# Patient Record
Sex: Female | Born: 1935 | Race: Black or African American | Hispanic: No | Smoking: Never smoker
Health system: Southern US, Community
[De-identification: ages and names within clinical notes are randomized; demographics above are authoritative.]

## PROBLEM LIST (undated history)

## (undated) VITALS — BP 152/78 | HR 90 | Ht 60.75 in | Wt 132.7 lb

## (undated) DIAGNOSIS — I10 Essential (primary) hypertension: Secondary | ICD-10-CM

## (undated) DIAGNOSIS — B029 Zoster without complications: Secondary | ICD-10-CM

---

## 2000-01-03 ENCOUNTER — Ambulatory Visit (HOSPITAL_COMMUNITY): Admission: RE | Admit: 2000-01-03 | Discharge: 2000-01-03 | Payer: Self-pay | Admitting: Family Medicine

## 2004-11-16 ENCOUNTER — Ambulatory Visit: Payer: Self-pay | Admitting: Family Medicine

## 2004-11-16 ENCOUNTER — Inpatient Hospital Stay (HOSPITAL_COMMUNITY): Admission: EM | Admit: 2004-11-16 | Discharge: 2004-11-18 | Payer: Self-pay | Admitting: *Deleted

## 2005-01-13 ENCOUNTER — Ambulatory Visit: Payer: Self-pay | Admitting: Family Medicine

## 2005-01-21 ENCOUNTER — Ambulatory Visit: Payer: Self-pay | Admitting: Family Medicine

## 2005-10-17 ENCOUNTER — Ambulatory Visit: Payer: Self-pay | Admitting: Family Medicine

## 2005-10-27 ENCOUNTER — Ambulatory Visit: Payer: Self-pay | Admitting: Family Medicine

## 2006-01-20 ENCOUNTER — Ambulatory Visit: Payer: Self-pay | Admitting: Sports Medicine

## 2006-02-21 ENCOUNTER — Ambulatory Visit: Payer: Self-pay | Admitting: Family Medicine

## 2006-02-21 ENCOUNTER — Encounter (INDEPENDENT_AMBULATORY_CARE_PROVIDER_SITE_OTHER): Payer: Self-pay | Admitting: *Deleted

## 2006-02-21 LAB — CONVERTED CEMR LAB
BUN: 24 mg/dL — ABNORMAL HIGH (ref 6–23)
CO2: 27 meq/L (ref 19–32)
Calcium: 9 mg/dL (ref 8.4–10.5)
Chloride: 102 meq/L (ref 96–112)
Creatinine, Ser: 1.01 mg/dL (ref 0.40–1.20)
Glucose, Bld: 90 mg/dL (ref 70–99)
Potassium: 4.8 meq/L (ref 3.5–5.3)
Sodium: 136 meq/L (ref 135–145)

## 2006-03-24 ENCOUNTER — Ambulatory Visit: Payer: Self-pay | Admitting: Family Medicine

## 2006-04-06 DIAGNOSIS — I10 Essential (primary) hypertension: Secondary | ICD-10-CM | POA: Insufficient documentation

## 2006-04-06 DIAGNOSIS — K5900 Constipation, unspecified: Secondary | ICD-10-CM | POA: Insufficient documentation

## 2007-05-15 ENCOUNTER — Encounter (INDEPENDENT_AMBULATORY_CARE_PROVIDER_SITE_OTHER): Payer: Self-pay | Admitting: *Deleted

## 2007-05-15 ENCOUNTER — Ambulatory Visit: Payer: Self-pay | Admitting: Family Medicine

## 2007-05-15 LAB — CONVERTED CEMR LAB
ALT: 12 units/L (ref 0–35)
AST: 22 units/L (ref 0–37)
Albumin: 3.8 g/dL (ref 3.5–5.2)
Alkaline Phosphatase: 67 units/L (ref 39–117)
BUN: 25 mg/dL — ABNORMAL HIGH (ref 6–23)
CO2: 28 meq/L (ref 19–32)
Calcium: 8.8 mg/dL (ref 8.4–10.5)
Chloride: 102 meq/L (ref 96–112)
Creatinine, Ser: 1.02 mg/dL (ref 0.40–1.20)
Direct LDL: 121 mg/dL — ABNORMAL HIGH
Glucose, Bld: 83 mg/dL (ref 70–99)
Potassium: 4 meq/L (ref 3.5–5.3)
Sodium: 139 meq/L (ref 135–145)
Total Bilirubin: 0.3 mg/dL (ref 0.3–1.2)
Total Protein: 6.9 g/dL (ref 6.0–8.3)

## 2007-05-16 ENCOUNTER — Encounter (INDEPENDENT_AMBULATORY_CARE_PROVIDER_SITE_OTHER): Payer: Self-pay | Admitting: *Deleted

## 2007-06-01 ENCOUNTER — Ambulatory Visit: Payer: Self-pay | Admitting: Family Medicine

## 2007-06-04 ENCOUNTER — Ambulatory Visit: Payer: Self-pay | Admitting: Family Medicine

## 2007-06-04 LAB — CONVERTED CEMR LAB: LDL Cholesterol: 121 mg/dL

## 2007-06-12 ENCOUNTER — Ambulatory Visit: Payer: Self-pay | Admitting: Family Medicine

## 2007-07-17 ENCOUNTER — Encounter (INDEPENDENT_AMBULATORY_CARE_PROVIDER_SITE_OTHER): Payer: Self-pay | Admitting: *Deleted

## 2007-07-17 ENCOUNTER — Ambulatory Visit: Payer: Self-pay | Admitting: Family Medicine

## 2007-07-17 DIAGNOSIS — N951 Menopausal and female climacteric states: Secondary | ICD-10-CM

## 2007-07-19 LAB — CONVERTED CEMR LAB: Vit D, 1,25-Dihydroxy: 13 — ABNORMAL LOW (ref 30–89)

## 2007-08-27 ENCOUNTER — Encounter: Admission: RE | Admit: 2007-08-27 | Discharge: 2007-08-27 | Payer: Self-pay | Admitting: *Deleted

## 2007-10-05 ENCOUNTER — Telehealth: Payer: Self-pay | Admitting: Family Medicine

## 2007-10-25 ENCOUNTER — Ambulatory Visit: Payer: Self-pay | Admitting: Family Medicine

## 2007-10-25 DIAGNOSIS — M810 Age-related osteoporosis without current pathological fracture: Secondary | ICD-10-CM | POA: Insufficient documentation

## 2008-03-07 ENCOUNTER — Ambulatory Visit: Payer: Self-pay | Admitting: Family Medicine

## 2008-03-07 DIAGNOSIS — M79609 Pain in unspecified limb: Secondary | ICD-10-CM

## 2009-01-23 ENCOUNTER — Ambulatory Visit: Payer: Self-pay | Admitting: Family Medicine

## 2009-01-23 ENCOUNTER — Emergency Department (HOSPITAL_COMMUNITY): Admission: EM | Admit: 2009-01-23 | Discharge: 2009-01-24 | Payer: Self-pay | Admitting: Emergency Medicine

## 2009-01-23 ENCOUNTER — Encounter: Payer: Self-pay | Admitting: Family Medicine

## 2009-01-26 LAB — CONVERTED CEMR LAB
BUN: 21 mg/dL (ref 6–23)
CO2: 22 meq/L (ref 19–32)
Calcium: 9.1 mg/dL (ref 8.4–10.5)
Chloride: 88 meq/L — ABNORMAL LOW (ref 96–112)
Creatinine, Ser: 0.98 mg/dL (ref 0.40–1.20)
Glucose, Bld: 107 mg/dL — ABNORMAL HIGH (ref 70–99)
Potassium: 3.6 meq/L (ref 3.5–5.3)
Sodium: 125 meq/L — ABNORMAL LOW (ref 135–145)

## 2009-02-11 ENCOUNTER — Ambulatory Visit: Payer: Self-pay | Admitting: Family Medicine

## 2009-02-11 ENCOUNTER — Encounter: Payer: Self-pay | Admitting: Family Medicine

## 2009-02-11 DIAGNOSIS — E871 Hypo-osmolality and hyponatremia: Secondary | ICD-10-CM | POA: Insufficient documentation

## 2009-02-12 LAB — CONVERTED CEMR LAB
BUN: 22 mg/dL (ref 6–23)
CO2: 21 meq/L (ref 19–32)
Calcium: 8.5 mg/dL (ref 8.4–10.5)
Chloride: 104 meq/L (ref 96–112)
Creatinine, Ser: 0.84 mg/dL (ref 0.40–1.20)
Glucose, Bld: 83 mg/dL (ref 70–99)
Potassium: 4.1 meq/L (ref 3.5–5.3)
Sodium: 139 meq/L (ref 135–145)

## 2009-10-09 ENCOUNTER — Telehealth: Payer: Self-pay | Admitting: Family Medicine

## 2009-10-28 ENCOUNTER — Ambulatory Visit: Payer: Self-pay | Admitting: Internal Medicine

## 2009-10-28 ENCOUNTER — Observation Stay (HOSPITAL_COMMUNITY): Admission: EM | Admit: 2009-10-28 | Discharge: 2009-10-29 | Payer: Self-pay | Admitting: Emergency Medicine

## 2009-10-29 ENCOUNTER — Encounter (INDEPENDENT_AMBULATORY_CARE_PROVIDER_SITE_OTHER): Payer: Self-pay | Admitting: Internal Medicine

## 2009-11-02 ENCOUNTER — Emergency Department (HOSPITAL_COMMUNITY)
Admission: EM | Admit: 2009-11-02 | Discharge: 2009-11-02 | Payer: Self-pay | Source: Home / Self Care | Admitting: Emergency Medicine

## 2009-11-19 ENCOUNTER — Ambulatory Visit: Payer: Self-pay | Admitting: Family Medicine

## 2010-01-22 ENCOUNTER — Ambulatory Visit: Payer: Self-pay | Admitting: Family Medicine

## 2010-01-26 ENCOUNTER — Ambulatory Visit: Payer: Self-pay | Admitting: Family Medicine
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
## 2010-03-09 NOTE — Assessment & Plan Note (Signed)
Summary: f/u from being in hospital,tcb   Vital Signs:  Patient profile:   77 year old female Height:      60.75 inches Weight:      129.7 pounds BMI:     24.80 Temp:     98.7 degrees F oral Pulse rate:   80 / minute BP sitting:   148 / 80  (left arm) Cuff size:   regular  Vitals Entered By: Jimmy Footman, CMA (November 19, 2009 11:48 AM) CC: HFU hypertension   Primary Care Provider:  Bobby Rumpf  MD  CC:  HFU hypertension.  History of Present Illness: HTN, follow up hospitalization for lightheadedness: Admitted to [facility] for brief observation rom 9/21 - 9/23 (not on FPTS) for lightheadedness (without LOC) felt to be related to mild dehydration. CT head was within normal limits as were EKG, cardiac enzymes and TSH. Patient was found to be hyponatremic to 130, as a result her HCTZ was discontinued and she was started on Norvasc 10 mg. Returned to [facility] for evulation secondary to "high blood pressure" - no symptoms were reported at that time. Blood pressure was noted to be in 170's systolics - patient was started on lisinopril 5 mg as well. Patient reported some lightheadedness with lisinopril as well - as a result she stopped taking it shortly after. She has not had any episodes of dizziness since then.   ROS: Denies chest pain, dyspnea, LE edema, focal neurological signs, cough, wheeze, vision change,     .       Current Medications (verified): 1)  Norvasc 10 Mg Tabs (Amlodipine Besylate) .Marland Kitchen.. 1 By Mouth Once Daily 2)  Calcium Carbonate 600 Mg  Tabs (Calcium Carbonate) .Marland Kitchen.. 1 By Mouth Two Times A Day 3)  Tylenol Extra Strength 500 Mg Tabs (Acetaminophen) .... One Tab By Mouth Q4h As Needed Pain.  Allergies (verified): No Known Drug Allergies  Physical Exam  General:  NAD, elderly female, pleasant Mouth:  moist mucus membranes  Lungs:  CTAB  Heart:  RRR, no murmurs  Pulses:  2+ radials  Neurologic:  alert & oriented X3, cranial nerves II-XII intact, and strength normal  in all extremities.   Psych:  Oriented X3 and memory intact for recent and remote.     Impression & Recommendations:  Problem # 1:  DIZZINESS (ICD-780.4) Assessment Improved  Likely secondary to mild dehydration with negative workup at hospital, resolution since stopping HCTZ and lisinopril. Will not restart these medications at this time. Advised patient to stay well hydrated. Will follow in three months.   Orders: [facility]- Est  Level 4 (16109)  Problem # 2:  HYPERTENSION, BENIGN SYSTEMIC (ICD-401.1)  More permissive goal of 150/90 given age. Will hold off on ACE-I and on diuretic given lightheadedness with both - will continue CCB as below. Follow up three months The following medications were removed from the medication list:    Hydrochlorothiazide 12.5 Mg Tabs (Hydrochlorothiazide) .Marland Kitchen... Take 1 tab  by mouth every morning Her updated medication list for this problem includes:    Norvasc 10 Mg Tabs (Amlodipine besylate) .Marland Kitchen... 1 by mouth once daily  BP today: 148/80 Prior BP: 183/73 (02/11/2009)  Labs Reviewed: K+: 4.1 (02/11/2009) Creat: : 0.84 (02/11/2009)   LDL: 121 (06/04/2007)     Orders: [facility]- Est  Level 4 (60454)  Complete Medication List: 1)  Norvasc 10 Mg Tabs (Amlodipine besylate) .Marland Kitchen.. 1 by mouth once daily 2)  Calcium Carbonate 600 Mg Tabs (Calcium carbonate) .Marland KitchenMarland KitchenMarland Kitchen 1  by mouth two times a day 3)  Tylenol Extra Strength 500 Mg Tabs (Acetaminophen) .... One tab by mouth q4h as needed pain.

## 2010-03-09 NOTE — Assessment & Plan Note (Signed)
Summary: f/u hypertension, hyponatremia/EO   Vital Signs:  Patient profile:   77 year old female Height:      60.75 inches Weight:      134 pounds Temp:     97.9 degrees F oral Pulse rate:   69 / minute BP sitting:   183 / 73  (left arm) Cuff size:   regular  Vitals Entered By: Tessie Fass CMA (February 11, 2009 2:35 PM) CC: F/U BP, hyponatremia Is Patient Diabetic? No Pain Assessment Patient in pain? no        Primary Care Provider:  Bobby Rumpf  MD  CC:  F/U BP and hyponatremia.  History of Present Illness: 1) Elevated BP: BP today 183/73, 180/84 on recheck.  Has missed past three days of medication due to travel. Denies headache, chest pain, dyspnea, LE edema, neurological symptoms, change in urination. Seen at [facility] in December due to concern for elevated BP at home. CT head for headache on 12/18 in ED showed no acute intracranial abnormality, showed some atrophy, chronic microvascular disease. Found to be hyponatremic to 124 (Na 125 on labs here on 12/17). Does not cook with salt, but not currently monitoring salt intake. Denies seizure activity, AMS.  Not exercising currently. Lives alone, says she does not need help with her medications.   Habits & Providers  Alcohol-Tobacco-Diet     Tobacco Status: never  Current Medications (verified): 1)  Norvasc 10 Mg Tabs (Amlodipine Besylate) .Marland Kitchen.. 1 By Mouth Once Daily 2)  Hydrochlorothiazide 12.5 Mg  Tabs (Hydrochlorothiazide) .... Take 1 Tab  By Mouth Every Morning 3)  Calcium Carbonate 600 Mg  Tabs (Calcium Carbonate) .Marland Kitchen.. 1 By Mouth Two Times A Day 4)  Tylenol Extra Strength 500 Mg Tabs (Acetaminophen) .... One Tab By Mouth Q4h As Needed Pain.  Allergies (verified): No Known Drug Allergies  Review of Systems       as per HPI  Physical Exam  General:  NAD, elderly female, pleasant Eyes:  PERRL, EOMI,  Mouth:  moist mucus membranes  Neck:  no JVD or carotid bruits  Lungs:  CTAB  Heart:  RRR, no murmurs    Pulses:  2+ radials  Extremities:  no edema  Neurologic:  alert & oriented X3 and cranial nerves II-XII intact.     Impression & Recommendations:  Problem # 1:  HYPERTENSION, BENIGN SYSTEMIC (ICD-401.1) Assessment Unchanged  Will recheck BMET w/ hyponatremia last check. Follow up four months. Permissive hypertension given age will set goal of 170/90. Likely discontinue HCTZ w/ hyponatremia.   Her updated medication list for this problem includes:    Norvasc 10 Mg Tabs (Amlodipine besylate) .Marland Kitchen... 1 by mouth once daily    Hydrochlorothiazide 12.5 Mg Tabs (Hydrochlorothiazide) .Marland Kitchen... Take 1 tab  by mouth every morning  Orders: Basic [facility] (78469-62952) [facility]- Est  Level 4 (99214)  BP today: 183/73 Prior BP: 201/91 (01/23/2009)  Labs Reviewed: K+: 3.6 (01/23/2009) Creat: : 0.98 (01/23/2009)   LDL: 121 (06/04/2007)     Problem # 2:  HYPONATREMIA (ICD-276.1) Assessment: New  Recheck BMET. Na 125 on last check likely secondary to "tea and toast",  diuretic (HCTZ). Consider d/c HCTZ based on this BMET. Permissive hypertension given age as above.   Orders: [facility]- Est  Level 4 (99214)  Complete Medication List: 1)  Norvasc 10 Mg Tabs (Amlodipine besylate) .Marland Kitchen.. 1 by mouth once daily 2)  Hydrochlorothiazide 12.5 Mg Tabs (Hydrochlorothiazide) .... Take 1 tab  by mouth every morning 3)  Calcium Carbonate 600 Mg Tabs (Calcium carbonate) .Marland Kitchen.. 1 by mouth two times a day 4)  Tylenol Extra Strength 500 Mg Tabs (Acetaminophen) .... One tab by mouth q4h as needed pain.  Patient Instructions: 1)  It was great to see you today!  2)  Continue your medication as before.  3)  Follow up in 4 months   Prevention & Chronic Care Immunizations   Influenza vaccine: Not documented    Tetanus booster: Not documented    Pneumococcal vaccine: Not documented    H. zoster vaccine: Not documented  Colorectal Screening   Hemoccult: Done.  (01/07/2006)   Hemoccult due: 01/08/2007    Colonoscopy:  Not documented   Colonoscopy due: Not Indicated  Other Screening   Pap smear: Not documented   Pap smear due: Not Indicated    Mammogram: Not documented   Mammogram due: Refused  (06/12/2007)    DXA bone density scan: Done.  (10/08/2005)   DXA scan due: None    Smoking status: never  (02/11/2009)  Lipids   Total Cholesterol: Not documented   LDL: 121  (06/04/2007)   LDL Direct: 121  (05/15/2007)   HDL: Not documented   Triglycerides: Not documented  Hypertension   Last Blood Pressure: 183 / 73  (02/11/2009)   Serum creatinine: 0.98  (01/23/2009)   BMP action: Ordered   Serum potassium 3.6  (01/23/2009)   Basic metabolic panel due: 08/11/2009    Hypertension flowsheet reviewed?: Yes   Progress toward BP goal: Unchanged  Self-Management Support :   Personal Goals (by the next clinic visit) :      Personal blood pressure goal: 170/90  (02/11/2009)   Patient will work on the following items until the next clinic visit to reach self-care goals:     Medications and monitoring: take my medicines every day, bring all of my medications to every visit, weigh myself weekly  (02/11/2009)     Eating: drink diet soda or water instead of juice or soda, use fresh or frozen vegetables, eat baked foods instead of fried foods  (02/11/2009)     Activity: take a 30 minute walk every day  (01/23/2009)    Hypertension self-management support: Written self-care plan  (02/11/2009)   Hypertension self-care plan printed.
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
## 2010-04-22 LAB — CBC
HCT: 31.1 % — ABNORMAL LOW (ref 36.0–46.0)
HCT: 33.5 % — ABNORMAL LOW (ref 36.0–46.0)
Hemoglobin: 10.6 g/dL — ABNORMAL LOW (ref 12.0–15.0)
Hemoglobin: 11.4 g/dL — ABNORMAL LOW (ref 12.0–15.0)
MCH: 30.6 pg (ref 26.0–34.0)
MCH: 30.7 pg (ref 26.0–34.0)
MCHC: 34 g/dL (ref 30.0–36.0)
MCHC: 34 g/dL (ref 30.0–36.0)
MCV: 90.1 fL (ref 78.0–100.0)
MCV: 90.4 fL (ref 78.0–100.0)
Platelets: 134 10*3/uL — ABNORMAL LOW (ref 150–400)
Platelets: 135 10*3/uL — ABNORMAL LOW (ref 150–400)
RBC: 3.45 MIL/uL — ABNORMAL LOW (ref 3.87–5.11)
RBC: 3.71 MIL/uL — ABNORMAL LOW (ref 3.87–5.11)
RDW: 14.8 % (ref 11.5–15.5)
RDW: 14.9 % (ref 11.5–15.5)
WBC: 5.1 10*3/uL (ref 4.0–10.5)
WBC: 6.3 10*3/uL (ref 4.0–10.5)

## 2010-04-22 LAB — DIFFERENTIAL
Basophils Absolute: 0 10*3/uL (ref 0.0–0.1)
Basophils Relative: 1 % (ref 0–1)
Eosinophils Absolute: 0 10*3/uL (ref 0.0–0.7)
Eosinophils Relative: 1 % (ref 0–5)
Lymphocytes Relative: 13 % (ref 12–46)
Lymphs Abs: 0.8 10*3/uL (ref 0.7–4.0)
Monocytes Absolute: 0.4 10*3/uL (ref 0.1–1.0)
Monocytes Relative: 7 % (ref 3–12)
Neutro Abs: 5 10*3/uL (ref 1.7–7.7)
Neutrophils Relative %: 79 % — ABNORMAL HIGH (ref 43–77)

## 2010-04-22 LAB — COMPREHENSIVE METABOLIC PANEL
ALT: 12 U/L (ref 0–35)
Albumin: 3.1 g/dL — ABNORMAL LOW (ref 3.5–5.2)
Alkaline Phosphatase: 67 U/L (ref 39–117)
BUN: 14 mg/dL (ref 6–23)
Calcium: 8.3 mg/dL — ABNORMAL LOW (ref 8.4–10.5)
Glucose, Bld: 95 mg/dL (ref 70–99)
Potassium: 3.5 mEq/L (ref 3.5–5.1)
Sodium: 141 mEq/L (ref 135–145)
Total Protein: 6.3 g/dL (ref 6.0–8.3)

## 2010-04-22 LAB — URINALYSIS, ROUTINE W REFLEX MICROSCOPIC
Bilirubin Urine: NEGATIVE
Glucose, UA: NEGATIVE mg/dL
Hgb urine dipstick: NEGATIVE
Ketones, ur: NEGATIVE mg/dL
Nitrite: NEGATIVE
Protein, ur: NEGATIVE mg/dL
Specific Gravity, Urine: 1.007 (ref 1.005–1.030)
Urobilinogen, UA: 0.2 mg/dL (ref 0.0–1.0)
pH: 7 (ref 5.0–8.0)

## 2010-04-22 LAB — BASIC METABOLIC PANEL
BUN: 20 mg/dL (ref 6–23)
CO2: 26 mEq/L (ref 19–32)
Calcium: 8.6 mg/dL (ref 8.4–10.5)
Chloride: 96 mEq/L (ref 96–112)
Creatinine, Ser: 0.86 mg/dL (ref 0.4–1.2)
GFR calc Af Amer: >60 mL/min (ref >60)
GFR calc non Af Amer: >60 mL/min (ref >60)
Glucose, Bld: 176 mg/dL — ABNORMAL HIGH (ref 70–99)
Potassium: 4.3 mEq/L (ref 3.5–5.1)
Sodium: 130 mEq/L — ABNORMAL LOW (ref 135–145)

## 2010-04-22 LAB — CARDIAC PANEL(CRET KIN+CKTOT+MB+TROPI)
CK, MB: 1.5 ng/mL (ref 0.3–4.0)
CK, MB: 1.9 ng/mL (ref 0.3–4.0)
Relative Index: 1.1 (ref 0.0–2.5)
Relative Index: 1.3 (ref 0.0–2.5)
Relative Index: 1.4 (ref 0.0–2.5)
Total CK: 115 U/L (ref 7–177)
Total CK: 116 U/L (ref 7–177)
Total CK: 133 U/L (ref 7–177)
Troponin I: 0.03 ng/mL (ref 0.00–0.06)
Troponin I: 0.03 ng/mL (ref 0.00–0.06)
Troponin I: 0.04 ng/mL (ref 0.00–0.06)

## 2010-04-22 LAB — POCT I-STAT, CHEM 8
Hemoglobin: 12.2 g/dL (ref 12.0–15.0)
Potassium: 4.2 mEq/L (ref 3.5–5.1)
Sodium: 134 mEq/L — ABNORMAL LOW (ref 135–145)
TCO2: 26 mmol/L (ref 0–100)

## 2010-04-22 LAB — LIPID PANEL
Cholesterol: 196 mg/dL (ref 0–200)
HDL: 64 mg/dL (ref >39)
LDL Cholesterol: 122 mg/dL — ABNORMAL HIGH (ref 0–99)
Total CHOL/HDL Ratio: 3.1 RATIO
Triglycerides: 51 mg/dL (ref <150)
VLDL: 10 mg/dL (ref 0–40)

## 2010-04-22 LAB — HEMOGLOBIN A1C
Hgb A1c MFr Bld: 6.4 % — ABNORMAL HIGH (ref <5.7)
Mean Plasma Glucose: 137 mg/dL — ABNORMAL HIGH (ref <117)

## 2010-04-22 LAB — GLUCOSE, CAPILLARY: Glucose-Capillary: 188 mg/dL — ABNORMAL HIGH (ref 70–99)

## 2010-04-22 LAB — TSH: TSH: 3.092 u[IU]/mL (ref 0.350–4.500)

## 2010-05-10 LAB — GLUCOSE, CAPILLARY: Glucose-Capillary: 104 mg/dL — ABNORMAL HIGH (ref 70–99)

## 2010-05-10 LAB — DIFFERENTIAL
Eosinophils Absolute: 0.1 10*3/uL (ref 0.0–0.7)
Lymphocytes Relative: 27 % (ref 12–46)
Lymphs Abs: 1.4 10*3/uL (ref 0.7–4.0)
Monocytes Relative: 9 % (ref 3–12)
Neutro Abs: 3.2 10*3/uL (ref 1.7–7.7)
Neutrophils Relative %: 63 % (ref 43–77)

## 2010-05-10 LAB — CBC
Platelets: 127 10*3/uL — ABNORMAL LOW (ref 150–400)
RBC: 3.67 MIL/uL — ABNORMAL LOW (ref 3.87–5.11)
WBC: 5.1 10*3/uL (ref 4.0–10.5)

## 2010-05-10 LAB — POCT I-STAT, CHEM 8
BUN: 16 mg/dL (ref 6–23)
Chloride: 92 mEq/L — ABNORMAL LOW (ref 96–112)
Potassium: 3.4 mEq/L — ABNORMAL LOW (ref 3.5–5.1)
Sodium: 124 mEq/L — ABNORMAL LOW (ref 135–145)
TCO2: 26 mmol/L (ref 0–100)

## 2010-05-10 LAB — SALICYLATE LEVEL: Salicylate Lvl: 6.5 mg/dL (ref 2.8–20.0)

## 2010-06-25 NOTE — Discharge Summary (Signed)
NAME:  Laurie Murphy, Laurie Murphy                ACCOUNT NO.:  [id number]   MEDICAL RECORD NO.:  [id number]          PATIENT TYPE:  INP   LOCATION:  4735                         FACILITY:  [facility]   PHYSICIAN:  Santiago Bumpers. Hensel, M.D.DATE OF BIRTH:  1912-10-29   DATE OF ADMISSION:  11/16/2004  DATE OF DISCHARGE:                                 DISCHARGE SUMMARY   DISCHARGE DIAGNOSES:  1.  Altered mental status.  2.  Urinary tract infection.  3.  Hyponatremia.   LABORATORY DATA AT ADMISSION:  Sodium was 124, potassium was 3.8, BUN was  10, creatinine 0.9, AST 36, ALT 17, alk phos 77, total bilirubin 1.3, total  protein 7.5, albumin 3.7. [place] blood cell count 7.3, hemoglobin 10,  hematocrit 29, platelet count 138. GFR calculated at 78. Urinalysis positive  for nitrite, moderate leukocyte esterase. Urinary culture positive for  greater than 100,000 colonies of E. coli. Blood cultures were negative. Iron  panel:  Iron was 42 with the normal range being 42 or greater, B12 was  within normal limits, total iron binding capacity was decreased at 241,  percent of iron saturation was 17% which is low, ferritin was normal at 69.  EKG:  Normal sinus rhythm at 66 beats per minute. Imaging:  CT:  No acute  intracranial abnormalities, mild atrophy. Chest x-ray:  No acute  cardiopulmonary process.   BRIEF HISTORY OF PRESENT ILLNESS:  The patient is a 77 year old female with  past medical history significant only for hypertension who was her normal  active self until the night before admission when she began feeling weak and  lightheaded. Friends noted confusion and found her disoriented and sluggish  the next day. The patient was brought into the emergency department where  she received IV fluids as well as Rocephin.   HOSPITAL COURSE:  The patient was admitted and rehydrated with D-5-normal  saline. CT of head showed no acute findings. Blood cultures were negative.  EKG was within normal limits. The patient  was found to have a UTI and was  started on Cipro 500 mg p.o. b.i.d. The patient was hyponatremic but this  corrected after rehydration. The patient remained afebrile throughout the  entire hospital course and was tolerating oral fluids at the time of  discharge. The patient was noted to have a normocytic anemia with the iron  panel results as above.   DISCHARGE MEDICATIONS:  1.  Norvasc 10 mg p.o. once daily.  2.  Ciprofloxacin 500 mg p.o. b.i.d. The patient will complete a 10-day      course.   DISCHARGE INSTRUCTIONS:  1.  Activity:  As tolerated.  2.  Diet:  No restrictions, as tolerated.   FOLLOW-UP APPOINTMENTS:  The patient is to return to see Dr. Seleta Rhymes, phone  number [phone], on November 26, 2004, at 2:15 p.m. at the [facility].      Benn Moulder, M.D.    ______________________________  Santiago Bumpers. Leveda Anna, M.D.    MR/MEDQ  D:  11/18/2004  T:  11/18/2004  Job:  [id number]

## 2010-06-25 NOTE — Discharge Summary (Signed)
NAMEDARL, BRISBIN                ACCOUNT NO.:  [id number]   MEDICAL RECORD NO.:  [id number]          PATIENT TYPE:  INP   LOCATION:  4735                         FACILITY:  [facility]   PHYSICIAN:  Benn Moulder, M.D.      DATE OF BIRTH:  05/06/1912   DATE OF ADMISSION:  11/16/2004  DATE OF DISCHARGE:                                 DISCHARGE SUMMARY   ADDENDUM:  job [id number]   DISCHARGE DIAGNOSES:  1.  Hypertension.  2.  Anemia.      Benn Moulder, M.D.     MR/MEDQ  D:  11/18/2004  T:  11/18/2004  Job:  [id number]
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
## 2010-09-17 ENCOUNTER — Ambulatory Visit (INDEPENDENT_AMBULATORY_CARE_PROVIDER_SITE_OTHER): Payer: Medicare Other | Admitting: Family Medicine

## 2010-09-17 ENCOUNTER — Encounter: Payer: Self-pay | Admitting: Family Medicine

## 2010-09-17 DIAGNOSIS — B029 Zoster without complications: Secondary | ICD-10-CM

## 2010-09-17 MED ORDER — VALACYCLOVIR HCL 1 G PO TABS: 1000.0000 mg | ORAL_TABLET | Freq: Two times a day (BID) | ORAL | Status: DC

## 2010-09-17 MED ORDER — TRAMADOL HCL 50 MG PO TABS: 50.0000 mg | ORAL_TABLET | Freq: Four times a day (QID) | ORAL | Status: AC | PRN

## 2010-09-17 NOTE — Progress Notes (Signed)
Subjective:     Kandee Escalante is a 77 y.o. female who presents for evaluation of a rash involving the thigh. Rash started 2 days ago. Lesions are clear, and raised in texture. Rash has not changed over time. Rash is painful. Associated symptoms: Pain started about 1-2 weeks before rash erupted.. Patient denies: abdominal pain, arthralgia, fever, headache and irritability. Patient has not had contacts with similar rash. Patient has not had new exposures (soaps, lotions, laundry detergents, foods, medications, plants, insects or animals).  The following portions of the patient's history were reviewed and updated as appropriate: allergies, current medications, past family history, past medical history, past social history, past surgical history and problem list.  Review of Systems Pertinent items are noted in HPI.    Objective:    There were no vitals taken for this visit. General:  alert, cooperative and no distress  Skin:  vesicles noted on extremities, on upper right thigh, both front and back, following single dermatome.  Vesicles with clear fluid, no purulence.      Assessment:    Herpes Zoster    Plan:    Medications: Valacyclovir as rash started less than 72 hours ago, and tramadol as needed for pain.. Both Verbal and written  patient instruction given. Follow up in 1 week.

## 2010-09-17 NOTE — Patient Instructions (Signed)
It was nice to meet you.  You have shingles, which can hurt and take a few weeks to get better.  You should take the Valacyclovir twice a day for a week, this will help your body fight the virus causing the rash. You should take this medicine no matter if you are hurting or not.   The Tramadol is a pain medicine, you can take the pills up to 4 times a day, when the pain bothers you, but if it does not hurt, you don't need it.  You can take it just at bedtime if you want.

## 2010-09-23 ENCOUNTER — Ambulatory Visit (INDEPENDENT_AMBULATORY_CARE_PROVIDER_SITE_OTHER): Payer: Medicare Other | Admitting: Family Medicine

## 2010-09-23 VITALS — BP 183/66 | HR 81 | Temp 97.7°F | Wt 131.0 lb

## 2010-09-23 DIAGNOSIS — B029 Zoster without complications: Secondary | ICD-10-CM

## 2010-09-23 NOTE — Patient Instructions (Signed)
I'm sorry you are feeling so badly.  I want you to take your Tramadol pills back to your pharmacy, and ask them to cut them all in half.  Then, I want you to take a half of a tablet with breakfast, lunch, and dinner, and at bedtime.    You can also take Tylenol- one extra strength tablet by mouth with breakfast, lunch, and dinner, and at bedtime.    If you are not feeling better, you can call the office to be seen at any time.  Please make an appointment to see me in about two weeks.

## 2010-09-24 NOTE — Assessment & Plan Note (Signed)
Patient almost finished with Valtrex course, have asked her to complete it.  Will have patient decrease tramadol dosage (cut 50 mg tabs in half) and try to take them more regularly for pain control.  Also suggested taking tylenol scheduled.  Follow up in one week.

## 2010-09-24 NOTE — Progress Notes (Signed)
  Subjective:    Patient ID: Laurie Murphy, female    DOB: 03-30-1912, 77 y.o.   MRN: [id number]  HPI  Laurie Murphy presents for follow up of her shingles.  She complains that she has not felt well all week.  She has been taking the Valtrex as scheduled.  She has only taken a few tramadol but they make her feel dizzy.  She has had some significant discomfort from the singles, she describes a painful, burning, itching sensation.  It bothers her especially at night time when she is trying to sleep.  She has felt weak and tired, has not felt like cooking for herself much.    Review of Systems Negative except HPI   Objective:   Physical Exam  Constitutional: She appears well-developed and well-nourished. No distress.  HENT:  Head: Normocephalic.  Eyes: EOM are normal. Pupils are equal, round, and reactive to light.  Cardiovascular: Normal rate and regular rhythm.   Pulmonary/Chest: Effort normal and breath sounds normal.  Skin: Rash noted. Rash is vesicular.             Assessment & Plan:

## 2010-09-27 ENCOUNTER — Telehealth: Payer: Self-pay | Admitting: Family Medicine

## 2010-09-27 NOTE — Telephone Encounter (Signed)
Pt is going to be out of Valtrex and still has shingles.  Needs some for tomorrow.  pls let pt know if she can get this. [facility]

## 2010-09-28 NOTE — Telephone Encounter (Signed)
Called to notify patient that this medication is a one time 7 day course.  She should not continue it.  I was unable to reach her.

## 2010-09-28 NOTE — Telephone Encounter (Signed)
Yes. This is to decrease the chance of permanent neuralgia, It only shortens the course of the rash by a day. The scabs usually finish falling off after about 10 days. The neuralgia often lasts longer and may need fairly strong pain medications. When the skin is healed you can start having them apply Zostrix five times daily. Lidoderm patches can also be used as can be gabapentin.

## 2010-09-28 NOTE — Telephone Encounter (Signed)
Hey Dr. Sheffield Slider-  I just wanted to check with you about Ms Ozdemir- you only do a 7 day course of Valtrex, even if shingles still present when course is finished?   Thanks, Ardyth Gal

## 2010-09-29 ENCOUNTER — Emergency Department (HOSPITAL_COMMUNITY)
Admission: EM | Admit: 2010-09-29 | Discharge: 2010-09-29 | Disposition: A | Discharge status: Home / Self Care | Payer: Medicare Other | Attending: Emergency Medicine | Admitting: Emergency Medicine

## 2010-09-29 ENCOUNTER — Ambulatory Visit (INDEPENDENT_AMBULATORY_CARE_PROVIDER_SITE_OTHER): Payer: Medicare Other | Admitting: Family Medicine

## 2010-09-29 VITALS — BP 160/72 | HR 96 | Temp 98.0°F | Wt 132.0 lb

## 2010-09-29 DIAGNOSIS — B029 Zoster without complications: Secondary | ICD-10-CM

## 2010-09-29 DIAGNOSIS — L989 Disorder of the skin and subcutaneous tissue, unspecified: Secondary | ICD-10-CM | POA: Insufficient documentation

## 2010-09-29 DIAGNOSIS — N390 Urinary tract infection, site not specified: Secondary | ICD-10-CM | POA: Insufficient documentation

## 2010-09-29 DIAGNOSIS — I10 Essential (primary) hypertension: Secondary | ICD-10-CM | POA: Insufficient documentation

## 2010-09-29 LAB — URINALYSIS, ROUTINE W REFLEX MICROSCOPIC
Glucose, UA: NEGATIVE mg/dL
Protein, ur: NEGATIVE mg/dL
Specific Gravity, Urine: 1.006 (ref 1.005–1.030)

## 2010-09-29 LAB — POCT I-STAT, CHEM 8
BUN: 13 mg/dL (ref 6–23)
Calcium, Ion: 1.14 mmol/L (ref 1.12–1.32)
Creatinine, Ser: 0.9 mg/dL (ref 0.50–1.10)
TCO2: 24 mmol/L (ref 0–100)

## 2010-09-29 LAB — POCT I-STAT TROPONIN I: Troponin i, poc: 0.01 ng/mL (ref 0.00–0.08)

## 2010-09-29 LAB — URINE MICROSCOPIC-ADD ON

## 2010-09-29 NOTE — Progress Notes (Signed)
  Subjective:    Patient ID: Laurie Murphy, female    DOB: 1912-08-20, 77 y.o.   MRN: [id number]  HPI  Patient presents for follow up of her shingles.  She is doing much better, the pain is much less than last visit.  She had her tramadol pills cut in half and now can tolerate them without dizziness. All of her vesicles have now burst and are scabbed over. She has finished her Valtrex and now understand she is only to take 7 days of it.    Review of Systems Negative except HPI.     Objective:   Physical Exam BP 160/72  Pulse 96  Temp(Src) 98 F (36.7 C) (Oral)  Wt 132 lb (59.875 kg) General appearance: alert, cooperative and no distress Extremities: extremities normal, atraumatic, no cyanosis or edema Pulses: 2+ and symmetric Skin: zoster rash on upper right thigh, few vesicles, many scabs present.        Assessment & Plan:  Continue Tramadol PRN. Follow up in 2 weeks.

## 2010-09-29 NOTE — Patient Instructions (Signed)
I am glad you are doing so much better.  You can wash your thigh/leg with regular soap, but keep the skin moist with a lotion, and you can put an antibiotic ointment like neosporin on the scabs.  If you have any further problems or your pain becomes worse, please call the office.

## 2010-09-30 NOTE — Telephone Encounter (Signed)
pls call pt to let her know

## 2010-10-01 ENCOUNTER — Emergency Department (HOSPITAL_COMMUNITY): Payer: Medicare Other

## 2010-10-01 ENCOUNTER — Emergency Department (HOSPITAL_COMMUNITY): Admission: EM | Admit: 2010-10-01 | Payer: Self-pay | Source: Ambulatory Visit | Admitting: Family Medicine

## 2010-10-01 ENCOUNTER — Inpatient Hospital Stay (HOSPITAL_COMMUNITY)
Admission: EM | Admit: 2010-10-01 | Discharge: 2010-10-04 | DRG: 309 | Disposition: A | Discharge status: Home / Self Care | Payer: Medicare Other | Attending: Family Medicine | Admitting: Family Medicine

## 2010-10-01 DIAGNOSIS — N390 Urinary tract infection, site not specified: Secondary | ICD-10-CM | POA: Diagnosis present

## 2010-10-01 DIAGNOSIS — R5383 Other fatigue: Secondary | ICD-10-CM | POA: Diagnosis present

## 2010-10-01 DIAGNOSIS — I4891 Unspecified atrial fibrillation: Principal | ICD-10-CM | POA: Diagnosis present

## 2010-10-01 DIAGNOSIS — I10 Essential (primary) hypertension: Secondary | ICD-10-CM | POA: Diagnosis present

## 2010-10-01 DIAGNOSIS — R5381 Other malaise: Secondary | ICD-10-CM | POA: Diagnosis present

## 2010-10-01 DIAGNOSIS — A498 Other bacterial infections of unspecified site: Secondary | ICD-10-CM | POA: Diagnosis present

## 2010-10-01 DIAGNOSIS — E871 Hypo-osmolality and hyponatremia: Secondary | ICD-10-CM | POA: Diagnosis present

## 2010-10-01 LAB — CBC
MCH: 29.6 pg (ref 26.0–34.0)
Platelets: 178 10*3/uL (ref 150–400)
RBC: 3.68 MIL/uL — ABNORMAL LOW (ref 3.87–5.11)
WBC: 6.8 10*3/uL (ref 4.0–10.5)

## 2010-10-01 LAB — POCT I-STAT, CHEM 8
Calcium, Ion: 1.14 mmol/L (ref 1.12–1.32)
HCT: 35 % — ABNORMAL LOW (ref 36.0–46.0)
TCO2: 24 mmol/L (ref 0–100)

## 2010-10-01 LAB — DIFFERENTIAL
Basophils Relative: 0 % (ref 0–1)
Eosinophils Absolute: 0 10*3/uL (ref 0.0–0.7)
Neutrophils Relative %: 62 % (ref 43–77)

## 2010-10-01 LAB — POCT I-STAT TROPONIN I: Troponin i, poc: 0.06 ng/mL (ref 0.00–0.08)

## 2010-10-01 NOTE — Telephone Encounter (Signed)
Unable to reach pt.  Will wait for callback [place], Maryjo Rochester

## 2010-10-02 DIAGNOSIS — I369 Nonrheumatic tricuspid valve disorder, unspecified: Secondary | ICD-10-CM

## 2010-10-02 DIAGNOSIS — I4891 Unspecified atrial fibrillation: Secondary | ICD-10-CM | POA: Diagnosis present

## 2010-10-02 LAB — FERRITIN: Ferritin: 89 ng/mL (ref 10–291)

## 2010-10-02 LAB — URINE CULTURE
Colony Count: >=100000
Culture  Setup Time: 201208230122

## 2010-10-02 LAB — COMPREHENSIVE METABOLIC PANEL
Albumin: 3 g/dL — ABNORMAL LOW (ref 3.5–5.2)
Alkaline Phosphatase: 60 U/L (ref 39–117)
BUN: 17 mg/dL (ref 6–23)
Potassium: 3.9 mEq/L (ref 3.5–5.1)
Sodium: 131 mEq/L — ABNORMAL LOW (ref 135–145)
Total Protein: 6.3 g/dL (ref 6.0–8.3)

## 2010-10-02 LAB — IRON AND TIBC
Iron: 53 ug/dL (ref 42–135)
Saturation Ratios: 18 % — ABNORMAL LOW (ref 20–55)
UIBC: 234 ug/dL

## 2010-10-02 LAB — CK TOTAL AND CKMB (NOT AT ARMC)
CK, MB: 2.1 ng/mL (ref 0.3–4.0)
Relative Index: 1.2 (ref 0.0–2.5)
Total CK: 181 U/L — ABNORMAL HIGH (ref 7–177)

## 2010-10-02 LAB — MAGNESIUM: Magnesium: 1.8 mg/dL (ref 1.5–2.5)

## 2010-10-02 LAB — FOLATE: Folate: 18.7 ng/mL

## 2010-10-02 LAB — MRSA PCR SCREENING: MRSA by PCR: NEGATIVE

## 2010-10-13 ENCOUNTER — Ambulatory Visit (INDEPENDENT_AMBULATORY_CARE_PROVIDER_SITE_OTHER): Payer: Medicare Other | Admitting: Family Medicine

## 2010-10-13 ENCOUNTER — Encounter: Payer: Self-pay | Admitting: Family Medicine

## 2010-10-13 VITALS — BP 188/71 | HR 98 | Temp 98.0°F | Wt 131.0 lb

## 2010-10-13 DIAGNOSIS — I10 Essential (primary) hypertension: Secondary | ICD-10-CM

## 2010-10-13 DIAGNOSIS — I4891 Unspecified atrial fibrillation: Secondary | ICD-10-CM

## 2010-10-13 DIAGNOSIS — B029 Zoster without complications: Secondary | ICD-10-CM

## 2010-10-13 MED ORDER — METOPROLOL SUCCINATE ER 25 MG PO TB24: 25.0000 mg | ORAL_TABLET | Freq: Every day | ORAL | Status: DC

## 2010-10-13 MED ORDER — POLYETHYLENE GLYCOL 3350 17 GM/SCOOP PO POWD: 17.0000 g | Freq: Every day | ORAL | Status: AC

## 2010-10-13 NOTE — Patient Instructions (Signed)
I am starting another blood pressure medication.  It is called metoprolol, you will take it once a day.    Please come back and see me in two weeks to check on your blood pressure and your shingles. Please call the office with any questions.

## 2010-10-14 NOTE — Assessment & Plan Note (Addendum)
BP elevated today.  Will start BB in setting of A-fib and HTN. Follow up in two weeks.

## 2010-10-14 NOTE — Discharge Summary (Signed)
  Laurie Murphy, Laurie Murphy                ACCOUNT NO.:  [id number]  MEDICAL RECORD NO.:  [id number]  LOCATION:  1423                         FACILITY:  [facility]  PHYSICIAN:  Brendia Sacks, MD    DATE OF BIRTH:  07/23/1912  DATE OF ADMISSION:  10/01/2010 DATE OF DISCHARGE:  10/04/2010                              DISCHARGE SUMMARY   PRIMARY CARE PHYSICIAN:  [facility].  CONDITION ON DISCHARGE:  Improved.  DISPOSITION:  Home with home health physical therapy.  DISCHARGE DIAGNOSES: 1. New diagnosis of atrial fibrillation with rapid ventricular     response. 2. Urinary tract infection. 3. Hypertension, stable. 4. Chronic mild hyponatremia.  HISTORY OF PRESENT ILLNESS:  This is a 77 year old woman who presented to the emergency room with a chief complaint of elevated blood pressure at home.  She came to the hospital for further evaluation, and at that time was noted to have atrial fibrillation with heart rate in the 150s.She was started on Cardizem infusion and subsequently converted to sinus rhythm.  HOSPITAL COURSE:  Laurie Murphy was admitted to the medical floor, and then diltiazem infusion transitioned to oral therapy.  Her rhythm has remained sinus and she is now stable for discharge.  She will continue on diltiazem and although her [place] score is 2, given her advanced age and transient nature of her atrial fibrillation we will place her on aspirin at this point.  We will have her follow up with her primary care physician. 1. Urinary tract infection.  We will complete a course of Cipro in the     outpatient setting. 2. Hypertension.  This has remained stable during this     hospitalization.  Continue on Norvasc and also on diltiazem. 3. Chronic hyponatremia.  Review of the patient's past medical records     demonstrates mild hyponatremia seen in September 2011, in December     2010, in October 2006.  This appears to be stable at this time.  No     further evaluation  at this time.  DISCHARGE INSTRUCTIONS:  The patient will be discharged to home with home health physical therapy.  DIET:  Heart healthy.  ACTIVITY:  As tolerated.  FOLLOWUP:  With [facility] in approximately 2 weeks.  DISCHARGE MEDICATIONS:  New: 1. Ciprofloxacin 250 mg p.o. b.i.d. to complete a 3-day course for     urinary tract infection. 2. Diltiazem CD 180 mg p.o. daily.  Resume the following medications: 1. Aspirin 81 mg p.o. daily, enteric coated. 2. Norvasc 10 mg p.o. daily. 3. Tramadol 50 mg p.o. q.6 h. as needed for pain, due to recent     shingles. 4. Travatan ophthalmic 0.004% as directed.  Discontinue cefuroxime.  TIME COORDINATING DISCHARGE:  35 minutes.     Brendia Sacks, MD     DG/MEDQ  D:  10/04/2010  T:  10/05/2010  Job:  [id number]  cc:   [facility]  Electronically Signed by Brendia Sacks  on 10/14/2010 10:01:14 PM

## 2010-10-14 NOTE — Progress Notes (Signed)
  Subjective:    Patient ID: Laurie Murphy, female    DOB: Feb 26, 1912, 77 y.o.   MRN: [id number]  HPI  Pt presents for hospital follow up and follow up of her shingles. She was in the hospital recently because her blood pressure was up, and found to be in a-fib with RVR.  She was started on Diltazem for that.  Pt states she is feeling OK, no palpitation, no chest pains.  She does not know why her blood pressure is still so high today.  Pt reports her shingles is getting much better.  She is still taking the tramadol for the pain.  Her daughter is here today with her to make sure she know what medications Ms. Griffee is taking and that she is still OK to live by herself.  When she was in the hospital, pt was recommended to have home health PT.  Pt declined, and does not want anyone in her house.  Her daughter states she wishes her mother would let the physical therapists come.  She is trying to convince her to let someone come and clean the house for her.   Review of Systems Negative except HPI.     Objective:   Physical Exam BP 188/71  Pulse 98  Temp(Src) 98 F (36.7 C) (Oral)  Wt 131 lb (59.421 kg) General appearance: alert, cooperative and no distress Eyes: conjunctivae/corneas clear. PERRL, EOM's intact. Fundi benign. Throat: lips, mucosa, and tongue normal; teeth and gums normal Neck: no adenopathy, no carotid bruit, no JVD, supple, symmetrical, trachea midline and thyroid not enlarged, symmetric, no tenderness/mass/nodules Lungs: clear to auscultation bilaterally Heart: regular rate and rhythm, S1, S2 normal, no murmur, click, rub or gallop Abdomen: soft, non-tender; bowel sounds normal; no masses,  no organomegaly Extremities: extremities normal, atraumatic, no cyanosis or edema Skin: Healing Zoster rash on right upper thigh and buttock.        Assessment & Plan:

## 2010-10-14 NOTE — Assessment & Plan Note (Signed)
Improving, continue tramadol as needed for pain.

## 2010-10-14 NOTE — Assessment & Plan Note (Signed)
Continue Diltiazem, will add BB for Blood pressure control, monitor for pulse and other side effects.

## 2010-10-25 ENCOUNTER — Encounter: Payer: Self-pay | Admitting: Family Medicine

## 2010-10-25 ENCOUNTER — Ambulatory Visit (INDEPENDENT_AMBULATORY_CARE_PROVIDER_SITE_OTHER): Payer: Medicare Other | Admitting: Family Medicine

## 2010-10-25 DIAGNOSIS — I4891 Unspecified atrial fibrillation: Secondary | ICD-10-CM

## 2010-10-25 DIAGNOSIS — B029 Zoster without complications: Secondary | ICD-10-CM

## 2010-10-25 DIAGNOSIS — I10 Essential (primary) hypertension: Secondary | ICD-10-CM

## 2010-10-25 DIAGNOSIS — E871 Hypo-osmolality and hyponatremia: Secondary | ICD-10-CM

## 2010-10-25 MED ORDER — HYDROCHLOROTHIAZIDE 25 MG PO TABS: 25.0000 mg | ORAL_TABLET | Freq: Every day | ORAL | Status: DC

## 2010-10-25 NOTE — Patient Instructions (Signed)
It was good to see you.  I am going to check your electrolytes (including Potassium) today.  I am starting Hydrochlorothiazide, or HCTZ for your blood pressure.  You should also continue with your other medications.    Please make an appointment to check on your blood pressure in about two weeks.

## 2010-10-26 ENCOUNTER — Encounter: Payer: Self-pay | Admitting: Family Medicine

## 2010-10-26 LAB — BASIC METABOLIC PANEL
BUN: 20 mg/dL (ref 6–23)
CO2: 26 mEq/L (ref 19–32)
Chloride: 101 mEq/L (ref 96–112)
Creat: 1.02 mg/dL (ref 0.50–1.10)
Potassium: 4 mEq/L (ref 3.5–5.3)

## 2010-10-26 NOTE — Assessment & Plan Note (Signed)
Healing rash, continue tramadol prn.

## 2010-10-26 NOTE — Assessment & Plan Note (Signed)
-  Rate controlled. ?-Continue current medications. ?

## 2010-10-26 NOTE — Assessment & Plan Note (Signed)
Still poorly controlled.  Will start HCTZ and check bmet today.  Follow up in 1-2 weeks for bp and lab check.

## 2010-10-26 NOTE — Progress Notes (Signed)
  Subjective:    Patient ID: Laurie Murphy, female    DOB: [birth year], 77 y.o.   MRN: [id number]  HPI  Pt presents for follow up of hypertension and atrial fibrillation.  She says she is taking her Norvasc, Diltiazem, and Metoprolol without difficulty.  She says when she checks her blood pressure at home it is 130's-150's systolic.  She denies any headaches, vision changes, palpitations, dyspnea, chest pain.  She says she used to take HCTZ and it had been discontinued because the doctor did not think she needed it anymore.  It never caused side effects and she is agreeable to re-starting that medication.  She says she is still taking a tramadol occasionally for her shingles, but it is much better.  The rash is healing.    Review of Systems Negative except HPI.     Objective:   Physical Exam BP 185/79  Pulse 85  Temp(Src) 98 F (36.7 C) (Oral)  Wt 129 lb (58.514 kg) General appearance: alert, cooperative and no distress Eyes: conjunctivae/corneas clear. PERRL, EOM's intact. Fundi benign. Neck: no adenopathy, no carotid bruit, no JVD, supple, symmetrical, trachea midline and thyroid not enlarged, symmetric, no tenderness/mass/nodules Lungs: clear to auscultation bilaterally Heart: irregularly irregular rhythm and rate controlled, no murmur, rubs, or gallops.  Extremities: extremities normal, atraumatic, no cyanosis or edema Pulses: 2+ and symmetric Skin: Healing shingles rash on right upper thigh and buttock.  Neurologic: Grossly normal       Assessment & Plan:

## 2010-10-26 NOTE — Assessment & Plan Note (Signed)
Will check bmet today 

## 2010-11-11 ENCOUNTER — Ambulatory Visit (INDEPENDENT_AMBULATORY_CARE_PROVIDER_SITE_OTHER): Payer: Medicare Other | Admitting: Family Medicine

## 2010-11-11 ENCOUNTER — Encounter: Payer: Self-pay | Admitting: Family Medicine

## 2010-11-11 DIAGNOSIS — B029 Zoster without complications: Secondary | ICD-10-CM

## 2010-11-11 DIAGNOSIS — I10 Essential (primary) hypertension: Secondary | ICD-10-CM

## 2010-11-11 MED ORDER — DILTIAZEM HCL ER COATED BEADS 180 MG PO CP24: 180.0000 mg | ORAL_CAPSULE | Freq: Every day | ORAL | Status: DC

## 2010-11-11 MED ORDER — ZOSTER VACCINE LIVE 19400 UNT/0.65ML ~~LOC~~ SOLR: 0.6500 mL | Freq: Once | SUBCUTANEOUS | Status: DC

## 2010-11-11 NOTE — Patient Instructions (Signed)
I have sent a prescription for Zostavax, the shingles prevention shot, to your pharmacy.  You have to get it filled (pick up a vial), and bring it back to the clinic and a nurse can give you a shot.  You can make a nurse visit to get the shot.  Keep doing coco butter and lotions on your skin where you had the singles.    If you blood pressure at home is ever greater than 160 systolic (top number), please call the office and ask to be seen.  Otherwise keep taking your blood pressure medications as you are currently taking.  I hope you enjoy your reunion!

## 2010-11-14 NOTE — Assessment & Plan Note (Addendum)
Pt now with healed shingles rash.  Advised continue shea butter for the area, but told her not sure if pigment will return to that area.   She would like to get the shingles shot at some point to prevent her from getting it again.

## 2010-11-14 NOTE — Assessment & Plan Note (Signed)
Patient with improved blood pressure in office and reports good pressures at home.  She did not start HCTZ and does not want to.  Considering age, goal systolic will be 213. Continue other medications, but will not start HCTZ for now.

## 2010-11-14 NOTE — Progress Notes (Signed)
  Subjective:    Patient ID: Laurie Murphy, female    DOB: 28-Apr-1912, 77 y.o.   MRN: [id number]  HPI  Laurie Murphy presents for follow up.  She did not start taking HCTZ that was prescribed last visit because she says she does not think she needs it.  She says she feels fine and does not like putting too many medicines into her body.  She says at home the systolic BP's are 130's and 140's.   Laurie Murphy does not want a flu shot.   Pt asks about the hypopigmentation on her skin where there were shingles.  She says she thinks she wants the singles shot because she does not ever want to get this again.   Review of Systems Negative for palpitations, dizziness, dyspnea.  Negative for new vesicles.     Objective:   Physical Exam  Constitutional: She appears well-developed and well-nourished. No distress.  Eyes: Conjunctivae and EOM are normal.  Neck: Neck supple.  Cardiovascular: Normal rate, regular rhythm and normal heart sounds.   Pulmonary/Chest: Effort normal and breath sounds normal.  Skin:       Hypo pigmented areas on upper right thigh and buttock where vesicles have healed.           Assessment & Plan:

## 2010-11-18 NOTE — H&P (Signed)
Laurie Murphy, Laurie Murphy                ACCOUNT NO.:  [id number]  MEDICAL RECORD NO.:  [id number]  LOCATION:  [facility]                         FACILITY:  [facility]  PHYSICIAN:  Rosanna Randy, MDDATE OF BIRTH:  1912-09-28  DATE OF ADMISSION:  10/01/2010 DATE OF DISCHARGE:                             HISTORY & PHYSICAL   PRIMARY CARE PHYSICIAN:  [facility]  CHIEF COMPLAINT:  Elevated blood pressure at home.  HISTORY OF PRESENT ILLNESS:  Laurie Murphy is a 77 year old female, in good health overall with a significant past medical history of hypertension, shingles, and also a history of glaucoma, who was recently diagnosed with an urinary tract infection and discharged home on some antibiotics, came to the emergency department complaining of elevated blood pressure. The patient reports that she checked her blood pressure at home using her wrist blood pressure monitor and it was really elevated, according to her above 190, at that moment she came into the hospital for further evaluation and treatment.  She reports having some funny feeling in her head and she attributed that due to her elevated blood pressure.  While she was in the emergency department, she was noted to have a new atrial fibrillation sustained with heart rate in the 150s.  At that moment, the patient was started on a Cardizem drip in order to control her accelerating hypertension and also her atrial fibrillation and [facility] hospitalist was called for further evaluation and treatment.  After discussing with the patient, she denies any chest pain, any diaphoresis, any shortness of breath, nausea, vomiting, abdominal pain, diarrhea, melena, hematochezia, hematemesis, and also any fever, chills, or cough. The patient reports that she has not had any history of heart problems in the past and that she is currently using just Norvasc in order to control her blood pressure.  ALLERGIES:  No known drug allergy.  PAST  MEDICAL HISTORY:  Significant for: 1. Hypertension. 2. History of shingles. 3. History of recent urinary tract infection diagnosed on September 29, 2010.  At that time, she was having some dysuria.  Microorganism     growing out of her urinary culture is more than 100,000 colonies of     E. coli, sensitivity is still pending at this point. 4. The patient also is with a history of glaucoma.  MEDICATIONS:  Include Norvasc and she was started on Ceftin on September 29, 2010, for her UTI.  She is also using aspirin.  SOCIAL HISTORY:  The patient lives in [place] by herself.  There are no family members nearby.  The patient is still able to drive and is doing all her activities of daily living without difficulties.  FAMILY HISTORY:  Noncontributory.  REVIEW OF SYSTEMS:  Positive just for ankle swelling, most likely associated with her Norvasc, since it is not new and there is also some constipation, a mild hearing loss, otherwise is negative except as mentioned on HPI.  PHYSICAL EXAMINATION:  VITAL SIGNS:  Temperature is 98.5, heart rate 111 with a respiratory rate of 19, blood pressure 136/87, oxygen saturation 98% on 2 liters. GENERAL:  The patient was lying in bed and in no acute distress  with a mild hearing loss. HEENT:  Normocephalic.  No trauma.  The patient was wearing nasal cannula, oxygen 2 liters, and very comfortable.  There was no paranasal sinuses, no drainage coming out of her ears or her noses.  Eyes, PERRLA. Extraocular muscles intact.  No icterus and no nystagmus.  Moist mucous membrane.  No erythema or exudates inside her throat. NECK:  Supple.  No thyromegaly or bruits. RESPIRATORY SYSTEM:  Clear to auscultation bilaterally. HEART:  Irregular, no murmurs. ABDOMEN:  Soft, nontender, and nondistended.  Positive bowel sounds. EXTREMITIES:  With traces to 1+ edema on her ankles bilaterally.  No cyanosis, no clubbing. SKIN:  No rash, no petechiae. NEUROLOGIC:  The  patient was alert, awake, and oriented x3.  Cranial nerves II-XII grossly intact.  Muscle strength 5/5 bilaterally symmetrically.  Normal deep tendon reflexes bilaterally symmetrically. There was no focal neurologic deficit.  PERTINENT LABORATORY DATA:  Includes an  i-STAT chemistry with a bicarb of 24, ionized calcium 1.14, hemoglobin 11.9, hematocrit 35, sodium 129, potassium 30.9, chloride 96, glucose 106, BUN 21, and creatinine 1.0. CBC is showing a Markowicz blood cells of 6.8, hemoglobin 10.9 with an MCV of 86.7, platelets 178.  First set of cardiac markers and troponin negative x1 with a troponin of 0.06.  IMAGING STUDIES:  The patient had a CT of the head without contrast that demonstrated no acute intracranial abnormality.  There is atrophy especially chronic microvascular disease.  No other abnormalities were appreciated.  ASSESSMENT AND PLAN: 1. Accelerated hypertension with associated atrial fibrillation     sustained.  At this point, the patient is going to be admitted to a     step-down.  We are going to start diltiazem drip which is going to     be titrated in order to control the patient's blood pressure and    heart rate.  We will go ahead and check a TSH, cardiac enzymes x3.     We will check a magnesium, phosphorus, calcium level, and will     order a 2-D echo and a repeat EKG in the morning.  Depending     results and the patient's response to treatment, she will     potentially require to be seen by cardiologist.  We will hold off     on a consultation at this point, but it is something that will be     important to follow in the morning and arrange if needed. 2. Urinary tract infection without signs or symptoms of severe sepsis.     At this point sensitivity is pending, but she is having an urinary     tract infection secondary to E. coli, we will go ahead and put the     patient on Rocephin 1 gram IV daily while waiting on sensitivity. 3. If the patient's shingles  is pretty much resolved at this point, no     further treatment is required. 4. Hyponatremia.  While looking at her records, it is a chronic     problem, most likely associated to a decreased intake in general.     At this point, we will go ahead and just monitor Kyne's     electrolytes level and if the problems continue or if she develops     any neurologic findings, then we will treat accordingly. 5. Deep vein thrombosis prophylaxis.  The patient has been started on     heparin 5000 units subcutaneously every 8 hours. 6. Anemia.  No signs  of bleeding and no history of bleeding either     making this condition most likely associated with anemia of chronic     disease.  We will go ahead and check an anemia panel in order to     corroborate diagnosis and depending the findings, we will go ahead     and treat the patient.     Rosanna Randy, MD     CEM/MEDQ  D:  10/02/2010  T:  10/02/2010  Job:  [id number]  cc:   [facility]  Electronically Signed by Vassie Loll MD on 11/18/2010 07:43:48 AM

## 2010-12-09 ENCOUNTER — Telehealth: Payer: Self-pay | Admitting: Family Medicine

## 2010-12-09 ENCOUNTER — Emergency Department (HOSPITAL_COMMUNITY)
Admission: EM | Admit: 2010-12-09 | Discharge: 2010-12-09 | Disposition: A | Discharge status: Home / Self Care | Payer: Medicare Other | Attending: Emergency Medicine | Admitting: Emergency Medicine

## 2010-12-09 DIAGNOSIS — I4460 Unspecified fascicular block: Secondary | ICD-10-CM | POA: Insufficient documentation

## 2010-12-09 DIAGNOSIS — I10 Essential (primary) hypertension: Secondary | ICD-10-CM | POA: Insufficient documentation

## 2010-12-09 DIAGNOSIS — M7989 Other specified soft tissue disorders: Secondary | ICD-10-CM

## 2010-12-09 DIAGNOSIS — R609 Edema, unspecified: Secondary | ICD-10-CM | POA: Insufficient documentation

## 2010-12-09 DIAGNOSIS — I82409 Acute embolism and thrombosis of unspecified deep veins of unspecified lower extremity: Secondary | ICD-10-CM | POA: Insufficient documentation

## 2010-12-09 LAB — COMPREHENSIVE METABOLIC PANEL
BUN: 17 mg/dL (ref 6–23)
CO2: 26 mEq/L (ref 19–32)
Calcium: 10.2 mg/dL (ref 8.4–10.5)
Chloride: 103 mEq/L (ref 96–112)
Creatinine, Ser: 0.92 mg/dL (ref 0.50–1.10)
GFR calc Af Amer: 59 mL/min — ABNORMAL LOW (ref >90)
GFR calc non Af Amer: 51 mL/min — ABNORMAL LOW (ref >90)
Glucose, Bld: 106 mg/dL — ABNORMAL HIGH (ref 70–99)
Total Bilirubin: 0.4 mg/dL (ref 0.3–1.2)

## 2010-12-09 LAB — CBC
HCT: 34.9 % — ABNORMAL LOW (ref 36.0–46.0)
Hemoglobin: 11.7 g/dL — ABNORMAL LOW (ref 12.0–15.0)
MCH: 29.8 pg (ref 26.0–34.0)
MCHC: 33.5 g/dL (ref 30.0–36.0)
RDW: 15.1 % (ref 11.5–15.5)

## 2010-12-09 LAB — DIFFERENTIAL
Basophils Absolute: 0 10*3/uL (ref 0.0–0.1)
Eosinophils Relative: 2 % (ref 0–5)
Lymphocytes Relative: 30 % (ref 12–46)
Monocytes Absolute: 0.4 10*3/uL (ref 0.1–1.0)
Monocytes Relative: 7 % (ref 3–12)
Neutro Abs: 3.4 10*3/uL (ref 1.7–7.7)

## 2010-12-09 NOTE — Telephone Encounter (Signed)
Dr. Zelphia Cairo from [facility] called today.    States that pt has a chronic lower leg DVT.  She feels that this is appropriate for outpatient workup.  States that if I am in agreement that she will give 1 dose of treatment dose lovenox to patient and have pt f/up with our clinic in the office tomorrow.  I agreed with this plan.  See ER note/assessment/plan in Echart.  Pt instructed to call at 8:30am for work in appointment for further assessment and plan.  Also if office staff receives this message and pt has not yet scheduled an appointment- would ask administrative staff to contact pt and schedule an appointment.

## 2010-12-10 ENCOUNTER — Encounter: Payer: Self-pay | Admitting: Family Medicine

## 2010-12-10 ENCOUNTER — Ambulatory Visit (INDEPENDENT_AMBULATORY_CARE_PROVIDER_SITE_OTHER): Payer: Medicare Other | Admitting: Family Medicine

## 2010-12-10 VITALS — BP 142/67 | HR 66 | Temp 97.9°F | Ht 64.0 in | Wt 125.0 lb

## 2010-12-10 DIAGNOSIS — I10 Essential (primary) hypertension: Secondary | ICD-10-CM

## 2010-12-10 DIAGNOSIS — I825Z9 Chronic embolism and thrombosis of unspecified deep veins of unspecified distal lower extremity: Secondary | ICD-10-CM | POA: Insufficient documentation

## 2010-12-10 MED ORDER — AMLODIPINE BESYLATE 10 MG PO TABS: 10.0000 mg | ORAL_TABLET | Freq: Every day | ORAL | Status: DC

## 2010-12-10 NOTE — Progress Notes (Signed)
  Subjective:    Patient ID: Laurie Murphy, female    DOB: [birth year], 77 y.o.   MRN: [id number]  HPI  Seen in ER last night and told DVT and FU in [facility] today.  Given lovenox x1 but no prescription.  Told she would need to come to [facility] for lovenox and coumadin.    Report from DVT is as follows:  Summary:   No evidence of acutedeep vein or superficial thrombosis   involving the left lower extremity. There is a short segment   of chronic DVT in the mid posterior tibial vein, (approx. 2   cm).    Review of Systems     Objective:   Physical Exam Left leg shows only mild below the knee swelling        Assessment & Plan:

## 2010-12-10 NOTE — Assessment & Plan Note (Signed)
Only treat with ASA and elevation.  This clot is at low risk for worsening or PE.  Conversely, coumadin anticoagulation would place this 77 year old independently living woman at high risk for complications.

## 2010-12-10 NOTE — Patient Instructions (Signed)
See Dr. Lula Olszewski in one month. Stay on your aspirin every day. It is a blood thinner and will help your blood clot. Whenever you take a long trip, you should get out and stretch your legs every hour. If your Lt leg swells (and it might) prop your legs up for a while. You do not have a serious blood clot because it is old and below your knee

## 2010-12-12 ENCOUNTER — Encounter (HOSPITAL_COMMUNITY): Payer: Self-pay | Admitting: *Deleted

## 2010-12-12 ENCOUNTER — Telehealth: Payer: Self-pay | Admitting: Family Medicine

## 2010-12-12 ENCOUNTER — Emergency Department (HOSPITAL_COMMUNITY)
Admission: EM | Admit: 2010-12-12 | Discharge: 2010-12-12 | Disposition: A | Discharge status: Home / Self Care | Payer: Medicare Other | Attending: Emergency Medicine | Admitting: Emergency Medicine

## 2010-12-12 DIAGNOSIS — I10 Essential (primary) hypertension: Secondary | ICD-10-CM | POA: Insufficient documentation

## 2010-12-12 DIAGNOSIS — M79609 Pain in unspecified limb: Secondary | ICD-10-CM | POA: Insufficient documentation

## 2010-12-12 DIAGNOSIS — Z79899 Other long term (current) drug therapy: Secondary | ICD-10-CM | POA: Insufficient documentation

## 2010-12-12 DIAGNOSIS — I825Z9 Chronic embolism and thrombosis of unspecified deep veins of unspecified distal lower extremity: Secondary | ICD-10-CM

## 2010-12-12 DIAGNOSIS — M7989 Other specified soft tissue disorders: Secondary | ICD-10-CM | POA: Insufficient documentation

## 2010-12-12 HISTORY — DX: Essential (primary) hypertension: I10

## 2010-12-12 HISTORY — DX: Zoster without complications: B02.9

## 2010-12-12 LAB — POCT I-STAT, CHEM 8
Chloride: 105 mEq/L (ref 96–112)
Glucose, Bld: 96 mg/dL (ref 70–99)
HCT: 35 % — ABNORMAL LOW (ref 36.0–46.0)
Potassium: 4.3 mEq/L (ref 3.5–5.1)

## 2010-12-12 LAB — CBC
Hemoglobin: 11.3 g/dL — ABNORMAL LOW (ref 12.0–15.0)
MCH: 29.5 pg (ref 26.0–34.0)
Platelets: 178 10*3/uL (ref 150–400)
RBC: 3.83 MIL/uL — ABNORMAL LOW (ref 3.87–5.11)
WBC: 6.1 10*3/uL (ref 4.0–10.5)

## 2010-12-12 LAB — URINALYSIS, ROUTINE W REFLEX MICROSCOPIC
Ketones, ur: NEGATIVE mg/dL
Leukocytes, UA: NEGATIVE
Nitrite: NEGATIVE
pH: 7.5 (ref 5.0–8.0)

## 2010-12-12 LAB — PROTIME-INR: Prothrombin Time: 13.2 seconds (ref 11.6–15.2)

## 2010-12-12 MED ORDER — ENOXAPARIN SODIUM 80 MG/0.8ML ~~LOC~~ SOLN
80.0000 mg | Freq: Once | SUBCUTANEOUS | Status: AC
  Administered 2010-12-12: 80 mg via SUBCUTANEOUS
  Filled 2010-12-12: qty 0.8

## 2010-12-12 MED ORDER — WARFARIN SODIUM 5 MG PO TABS
5.0000 mg | ORAL_TABLET | Freq: Once | ORAL | Status: DC
  Filled 2010-12-12: qty 1

## 2010-12-12 MED ORDER — WARFARIN SODIUM 5 MG PO TABS: 5.0000 mg | ORAL_TABLET | Freq: Every day | ORAL | Status: DC

## 2010-12-12 NOTE — ED Notes (Signed)
Patient refused ordered coumadin

## 2010-12-12 NOTE — ED Provider Notes (Signed)
History     CSN: [id number] Arrival date & time: 12/12/2010 11:51 AM   First MD Initiated Contact with Patient 12/12/10 1212      Chief Complaint  Patient presents with  . Leg Swelling    (Consider location/radiation/quality/duration/timing/severity/associated sxs/prior treatment) Patient is a 77 y.o. female presenting with leg pain. The history is provided by the patient. No language interpreter was used.  Leg Pain  The incident occurred more than 1 week ago. The incident occurred at home. There was no injury mechanism. Pain location: bilateral calfs. The quality of the pain is described as aching. The pain is at a severity of 6/10. The pain is moderate. The pain has been constant since onset. Pertinent negatives include no numbness, no inability to bear weight, no loss of motion, no muscle weakness, no loss of sensation and no tingling. She reports no foreign bodies present. The symptoms are aggravated by bearing weight. She has tried nothing for the symptoms. The treatment provided no relief.  Has swelling L>R and had doppler on Friday which she did not feel was adequate.  No CP, SOB, DOE, n/v/d.    Past Medical History  Diagnosis Date  . Hypertension   . Shingles     History reviewed. No pertinent past surgical history.  No family history on file.  History  Substance Use Topics  . Smoking status: Never Smoker   . Smokeless tobacco: Not on file  . Alcohol Use: No    OB History    Grav Para Term Preterm Abortions TAB SAB Ect Mult Living                  Review of Systems  Constitutional: Negative for activity change.  HENT: Negative.  Negative for ear discharge.   Eyes: Negative for discharge.  Respiratory: Negative.  Negative for apnea.   Cardiovascular: Positive for leg swelling. Negative for chest pain.  Gastrointestinal: Negative for abdominal distention.  Genitourinary: Negative for dysuria, difficulty urinating and dyspareunia.  Musculoskeletal: Negative for  back pain and arthralgias.  Skin: Negative.   Neurological: Negative for tingling and numbness.  Hematological: Negative.   Psychiatric/Behavioral: Negative.     Allergies  Review of patient's allergies indicates no known allergies.  Home Medications   Current Outpatient Rx  Name Route Sig Dispense Refill  . AMLODIPINE BESYLATE 10 MG PO TABS Oral Take 10 mg by mouth daily.      . ASPIRIN 81 MG PO TABS Oral Take 81 mg by mouth daily. Takes half tablet every morning and 1/2 tablet at night    . DILTIAZEM HCL COATED BEADS 180 MG PO CP24 Oral Take 180 mg by mouth daily.      Marland Kitchen METOPROLOL SUCCINATE 25 MG PO TB24 Oral Take 25 mg by mouth daily.      Carma Leaven M PLUS PO TABS Oral Take 1 tablet by mouth daily.      . TRAVOPROST (BAK FREE) 0.004 % OP SOLN Left Eye Place 1 drop into the left eye at bedtime.        BP 136/58  Pulse 71  Temp(Src) 97.9 F (36.6 C) (Oral)  SpO2 100%  Physical Exam  Constitutional: She is oriented to person, place, and time. She appears well-developed and well-nourished. No distress.  HENT:  Head: Normocephalic and atraumatic.  Eyes: EOM are normal. Pupils are equal, round, and reactive to light.  Neck: Normal range of motion. Neck supple.  Cardiovascular: Normal rate and regular rhythm.   Pulmonary/Chest:  Effort normal and breath sounds normal.  Abdominal: Soft. Bowel sounds are normal. She exhibits no distension.  Musculoskeletal: Normal range of motion.       SWELLING l> r CALF 2+ DORSALIS PEDIS  Neurological: She is alert and oriented to person, place, and time. No cranial nerve deficit.  Skin: Skin is warm and dry.  Psychiatric: She has a normal mood and affect.    ED Course  Procedures (including critical care time)   Labs Reviewed  CBC  I-STAT, CHEM 8  URINALYSIS, ROUTINE W REFLEX MICROSCOPIC  URINE CULTURE   No results found.   No diagnosis found.    MDM  Case d/w Dr. Denyse Amass, dose of lovenox start coumadin follow up in clinic  tomorrow        Esiah Bazinet K Verda Mehta-Rasch, MD 12/12/10 1609

## 2010-12-12 NOTE — Progress Notes (Signed)
*  PRELIMINARY RESULTS*   Bilateral lower extremity venous dopplers have been performed.  No acute deep vein thrombosis noted in the bilateral lower extremities.  There are small areas of chronic clot noted in the left posterior tibial and peroneal veins.  Sherren Kerns Renee 12/12/2010, 3:31 PM

## 2010-12-12 NOTE — ED Notes (Signed)
Notified pharmacy of pt weight and height for Lovenox dosing

## 2010-12-12 NOTE — ED Notes (Signed)
Pt seen on Friday for swelling in her legs, legs remain swollen

## 2010-12-12 NOTE — Telephone Encounter (Signed)
Was seen at [facility] ED with leg pain due to chronic DVT.  Discussed with ED doctor. Pt does not want to be admitted to [facility]. She would like to be seen in clinic on Monday morning. Prior to D/C EDP will give lovenox and warfarin.

## 2010-12-12 NOTE — ED Notes (Signed)
Patient states she does not want to take coumadin as prescribed MD aware.

## 2010-12-12 NOTE — Progress Notes (Deleted)
*  PRELIMINARY RESULTS*  Bilateral lower extremity venous dopplers have been performed.  There is no acute deep vein thrombosis noted in the bilateral lower extremities.  There are small areas of chronic clot noted in the left posterior tibial and peroneal veins.  Sherren Kerns Renee 12/12/2010, 3:29 PM

## 2010-12-13 ENCOUNTER — Ambulatory Visit (INDEPENDENT_AMBULATORY_CARE_PROVIDER_SITE_OTHER): Payer: Medicare Other | Admitting: Family Medicine

## 2010-12-13 ENCOUNTER — Encounter: Payer: Self-pay | Admitting: Family Medicine

## 2010-12-13 VITALS — BP 132/51 | HR 84 | Temp 98.2°F

## 2010-12-13 DIAGNOSIS — I825Z9 Chronic embolism and thrombosis of unspecified deep veins of unspecified distal lower extremity: Secondary | ICD-10-CM

## 2010-12-13 NOTE — Patient Instructions (Signed)
I want you to get a pair of support hose or compression hose and wear them during the day. Continue to try to elevate your left leg whenever you can. Walking is good for you. I agree with Dr. Leveda Anna that putting you on coumadin would probably not be a good idea.

## 2010-12-13 NOTE — Progress Notes (Signed)
Subjective: The patient is a 77 year old female who had an old a DVT of her left lower extremity diagnosed several days ago. After discussion with her physicians, it was decided that she would make a poor candidate for long-term anticoagulation. Over the weekend she had some additional problems with swelling in her left lower extremity, and her family urged her to go to the emergency department. She went to the emergency department where she was diagnosed, again, with the same chronic left lower DVT. There is no evidence of any extension. Initially the ED physician had wanted her to be admitted and started on anticoagulation. The patient was not interested in this, and a compromise was made she will follow up today.  Today the patient does not have any new complaints. She does have questions about why her legs are swelling. She does not understand why, if the clots are old, she is only recently started having problems with swelling. She is not having significant problems with pain, and has been having no problems with shortness of breath, or chest pain. While in her report she is reported to have had bilateral doplers, the patient adamantly claims that they have not done one of her right leg.  Objective:  Filed Vitals:   12/13/10 1638  BP: 132/51  Pulse: 84  Temp: 98.2 F (36.8 C)   Gen: NAD, appropriate throughout CV: RRR Resp: CTABL Ext: 1+ edema bilateral lower extremities, L>R, no significant venous stasis changes, no significant pain with palpation, no erythema, full ROM  Assessment/Plan:  Please also see individual problems in problem list for problem-specific plans.

## 2010-12-14 LAB — URINE CULTURE

## 2010-12-15 NOTE — ED Notes (Signed)
Patient is on Coumadin.Copy of labs faxed to PCP for review.

## 2010-12-21 ENCOUNTER — Telehealth: Payer: Self-pay | Admitting: Family Medicine

## 2010-12-21 NOTE — Telephone Encounter (Signed)
Patient requesting a copy of the two ultrasounds that were completed the first of the month.  Told her they would be up front and ready for her to pick up.

## 2010-12-21 NOTE — Telephone Encounter (Signed)
Message left to return call.

## 2010-12-21 NOTE — Telephone Encounter (Signed)
Laurie Murphy need you to obtain a copy of her films from the vascular lab so she can discuss results with you at next visit on 01/03/11

## 2010-12-21 NOTE — Telephone Encounter (Signed)
Patient asking to speak with triage RN right away about getting a copy of her ultrasound reports, says she is having swelling in her legs and needs to get something from the report.

## 2010-12-22 NOTE — Telephone Encounter (Signed)
I have them, the were done within the [facility] system so they are in Epic.

## 2010-12-26 NOTE — Assessment & Plan Note (Signed)
Agree with the assessment by Dr. Leveda Anna.  Discussed with the patient, at great length, the downsides of anticoagulation and that she should expect to have some swelling intermittently.  I also stressed to the patient that she should not go to the ER for this and should instead try to get in to see [place] if she has further concerns as the ER will likely result in over-testing for a known issue.

## 2011-01-03 ENCOUNTER — Ambulatory Visit (INDEPENDENT_AMBULATORY_CARE_PROVIDER_SITE_OTHER): Payer: Medicare Other | Admitting: Family Medicine

## 2011-01-03 ENCOUNTER — Encounter: Payer: Self-pay | Admitting: Family Medicine

## 2011-01-03 VITALS — BP 151/71 | HR 65 | Temp 97.6°F | Ht 64.0 in | Wt 126.6 lb

## 2011-01-03 DIAGNOSIS — I10 Essential (primary) hypertension: Secondary | ICD-10-CM

## 2011-01-03 DIAGNOSIS — I825Z9 Chronic embolism and thrombosis of unspecified deep veins of unspecified distal lower extremity: Secondary | ICD-10-CM

## 2011-01-03 DIAGNOSIS — E46 Unspecified protein-calorie malnutrition: Secondary | ICD-10-CM | POA: Insufficient documentation

## 2011-01-03 NOTE — Assessment & Plan Note (Signed)
Discussed with pt that swelling is likely from DVT, and would take a long time to reabsorb.  Advised continuing Asprin 81 po bid.  Also advised continuing wearing compression stockings, but that she may need tighter compression.

## 2011-01-03 NOTE — Assessment & Plan Note (Signed)
Patient is at her goal for bp control.  Continue current medications.

## 2011-01-03 NOTE — Patient Instructions (Signed)
It was good to see you.  Please keep taking your baby Asprin twice a day.    Your lab work from the hospital showed that your protein in your blood is a little low, meaning you are not getting enough protein and calories.  I want you to try a nutrition shake like ensure or boost, you can take these up to three times a day.  Greek yogurt is also good for getting more protein.    Keep wearing your stockings on your legs to help with the swelling.  You may need tighter stockings.

## 2011-01-03 NOTE — Progress Notes (Signed)
  Subjective:    Patient ID: Laurie Murphy, female    DOB: 1912/03/03, 77 y.o.   MRN: [id number]  HPI  Laurie Murphy comes in for follow up after being found to have a DVT.  She says she went to the ED for high blood pressure again, and she had swelling in her legs, so they got an ultrasound.  It looked like it had been there a long time.  She says she is still having some swelling in her lower legs, but it is the left (where the DVT is) more than the right.  She denies pain.  She is wearing some compression stockings.   BP- Pt says her blood pressures have been well controlled at home.  Her readings range from 130's-150's systolic's.  She denies any headaches, dizziness, chest pain, vision changes.   Low albumin- Patient says when she was in the hospital they had her drink some kind of nutrition shake.  She is eating greek yogurt about twice a day at home.  She does admit her appetite is not what it used to be.   Review of Systems Pertinent items noted in HPI.     Objective:   Physical Exam BP 151/71  Pulse 65  Temp(Src) 97.6 F (36.4 C) (Oral)  Ht 5\' 4"  (1.626 m)  Wt 126 lb 9 oz (57.408 kg)  BMI 21.72 kg/m2 General appearance: alert, cooperative and no distress Head: Normocephalic, without obvious abnormality Eyes: conjunctivae/corneas clear. PERRL, EOM's intact. Fundi benign. Lungs: clear to auscultation bilaterally Heart: regular rate and rhythm, S1, S2 normal, no murmur, click, rub or gallop Extremities: trace edema in left lower leg.  No calf pain.  Pulses: 2+ and symmetric Skin: Skin color, texture, turgor normal. No rashes or lesions Neurologic: Grossly normal       Assessment & Plan:

## 2011-01-03 NOTE — Assessment & Plan Note (Signed)
Patient with low albumin on recent CMET.  Advised that low protein can contribute to swelling.  Advised to continue greek yogurt, but that she should also try to find a nutrition shake she likes, should take it 2-3 x/day.

## 2011-02-22 ENCOUNTER — Other Ambulatory Visit: Payer: Self-pay | Admitting: Sports Medicine

## 2011-02-22 NOTE — Telephone Encounter (Signed)
Refill request

## 2011-02-23 ENCOUNTER — Other Ambulatory Visit: Payer: Self-pay | Admitting: *Deleted

## 2011-02-23 MED ORDER — AMLODIPINE BESYLATE 10 MG PO TABS: 10.0000 mg | ORAL_TABLET | Freq: Every day | ORAL | Status: DC

## 2011-02-24 ENCOUNTER — Other Ambulatory Visit: Payer: Self-pay | Admitting: Sports Medicine

## 2011-02-24 NOTE — Telephone Encounter (Signed)
Refill request

## 2011-05-26 ENCOUNTER — Other Ambulatory Visit: Payer: Self-pay | Admitting: Family Medicine

## 2011-08-16 ENCOUNTER — Other Ambulatory Visit: Payer: Self-pay | Admitting: *Deleted

## 2011-08-16 MED ORDER — METOPROLOL SUCCINATE ER 25 MG PO TB24: 25.0000 mg | ORAL_TABLET | Freq: Every day | ORAL | Status: DC

## 2011-09-02 ENCOUNTER — Ambulatory Visit (INDEPENDENT_AMBULATORY_CARE_PROVIDER_SITE_OTHER): Payer: Medicare Other | Admitting: Family Medicine

## 2011-09-02 ENCOUNTER — Encounter: Payer: Self-pay | Admitting: Family Medicine

## 2011-09-02 VITALS — BP 188/46 | HR 89 | Temp 97.7°F | Ht 64.0 in | Wt 129.2 lb

## 2011-09-02 DIAGNOSIS — I10 Essential (primary) hypertension: Secondary | ICD-10-CM

## 2011-09-02 DIAGNOSIS — Z0000 Encounter for general adult medical examination without abnormal findings: Secondary | ICD-10-CM

## 2011-09-02 MED ORDER — AMLODIPINE BESYLATE 10 MG PO TABS: 10.0000 mg | ORAL_TABLET | Freq: Every day | ORAL | Status: DC

## 2011-09-02 MED ORDER — METOPROLOL SUCCINATE ER 25 MG PO TB24: 25.0000 mg | ORAL_TABLET | Freq: Every day | ORAL | Status: DC

## 2011-09-02 NOTE — Patient Instructions (Signed)
It was good to see you, I am glad you are feeling well.  Your blood pressure was 140/80.  Please keep taking your medications every day.  Please call the office if you have any problems or questions.

## 2011-09-03 DIAGNOSIS — Z7189 Other specified counseling: Secondary | ICD-10-CM | POA: Insufficient documentation

## 2011-09-03 NOTE — Progress Notes (Signed)
  Subjective:    Patient ID: Chanah Tidmore, female    DOB: 06-Nov-1912, 78 y.o.   MRN: [id number]  HPI  Ms. Streb comes in for follow up.  She says she is doing very well and feeling fine.  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  She says she is feeling well and declines having any lab work done today.   Past Medical History  Diagnosis Date  . Hypertension   . Shingles    History  Substance Use Topics  . Smoking status: Never Smoker   . Smokeless tobacco: Not on file  . Alcohol Use: No    Review of Systems  Constitutional: Negative for unexpected weight change.  HENT: Negative for rhinorrhea.   Eyes: Negative for visual disturbance.  Respiratory: Negative for shortness of breath.   Cardiovascular: Negative for chest pain.  Gastrointestinal: Negative for abdominal pain.  Genitourinary: Negative for dysuria.  Musculoskeletal: Negative for gait problem.  Skin: Negative for rash.  Neurological: Negative for syncope.  Hematological: Does not bruise/bleed easily.       Objective:   Physical Exam BP 188/46  Pulse 89  Temp 97.7 F (36.5 C) (Oral)  Ht 5\' 4"  (1.626 m)  Wt 129 lb 4 oz (58.627 kg)  BMI 22.19 kg/m2 Re-Check Manual BP: 145/70 General appearance: alert, cooperative and no distress Eyes: conjunctivae/corneas clear. PERRL, EOM's intact. Fundi benign. Throat: lips, mucosa, and tongue normal; teeth and gums normal Neck: no adenopathy, no carotid bruit, no JVD, supple, symmetrical, trachea midline and thyroid not enlarged, symmetric, no tenderness/mass/nodules Lungs: clear to auscultation bilaterally Heart: regular rate and rhythm, S1, S2 normal, no murmur, click, rub or gallop Extremities: extremities normal, atraumatic, no cyanosis or edema Pulses: 2+ and symmetric       Assessment & Plan:

## 2011-09-03 NOTE — Assessment & Plan Note (Signed)
Patient declines any labs or vaccinations today- she does agree to come in for a flu shot next fall.

## 2011-09-03 NOTE — Assessment & Plan Note (Signed)
Elevated, but within acceptable limits on manual re-check.  Will continue amlodipine and metoprolol.

## 2011-09-23 ENCOUNTER — Telehealth: Payer: Self-pay | Admitting: *Deleted

## 2011-09-23 NOTE — Telephone Encounter (Signed)
Called patient back- she says she has medicare and blue cross and blue shield.  She says that there have been some things what were illegible? And [employer] has told her they won't pay for things because of this.  She is having quite a bit of difficulty explaining to me what she wants/needs me to do.  She says she wants to make sure she is getting her full benefits.  I told her I would speak with the front desk to see if we can sort out what she means.  We will contact the billing office for [facility].

## 2011-09-23 NOTE — Telephone Encounter (Signed)
Would like to speak to pcp regarding payment for services .Laurie Murphy

## 2011-09-23 NOTE — Telephone Encounter (Signed)
Pt is asking that pcp call back today if possible around 1230. She stated that [employer] has asked her to contact her pcp regarding this matter?

## 2011-10-07 ENCOUNTER — Other Ambulatory Visit: Payer: Self-pay | Admitting: Family Medicine

## 2012-01-17 ENCOUNTER — Ambulatory Visit (INDEPENDENT_AMBULATORY_CARE_PROVIDER_SITE_OTHER): Payer: Medicare Other | Admitting: Family Medicine

## 2012-01-17 VITALS — BP 143/83 | HR 79 | Temp 98.3°F | Ht 64.0 in | Wt 133.0 lb

## 2012-01-17 DIAGNOSIS — R5383 Other fatigue: Secondary | ICD-10-CM

## 2012-01-17 LAB — CBC WITH DIFFERENTIAL/PLATELET
Basophils Absolute: 0 10*3/uL (ref 0.0–0.1)
Lymphocytes Relative: 23 % (ref 12–46)
Neutro Abs: 4.8 10*3/uL (ref 1.7–7.7)
Platelets: 158 10*3/uL (ref 150–400)
RDW: 14.4 % (ref 11.5–15.5)
WBC: 7.1 10*3/uL (ref 4.0–10.5)

## 2012-01-17 LAB — POCT URINALYSIS DIPSTICK
Leukocytes, UA: NEGATIVE
Protein, UA: NEGATIVE
Spec Grav, UA: 1.01
Urobilinogen, UA: 0.2
pH, UA: 6.5

## 2012-01-17 LAB — COMPREHENSIVE METABOLIC PANEL
ALT: 9 U/L (ref 0–35)
Albumin: 3.7 g/dL (ref 3.5–5.2)
CO2: 25 mEq/L (ref 19–32)
Calcium: 9.3 mg/dL (ref 8.4–10.5)
Chloride: 98 mEq/L (ref 96–112)
Creat: 1 mg/dL (ref 0.50–1.10)
Potassium: 4.9 mEq/L (ref 3.5–5.3)
Sodium: 133 mEq/L — ABNORMAL LOW (ref 135–145)
Total Protein: 7.1 g/dL (ref 6.0–8.3)

## 2012-01-17 NOTE — Progress Notes (Signed)
  Subjective:    Patient ID: Laurie Murphy, female    DOB: 07/18/1912, 78 y.o.   MRN: [id number]  HPI  Work in for "not feeling well"  For about the past week has felt "weak",.  Unable to describe further.  When asked if she is eating well, she states Has been putting more fruits in diet, but may forget to eat at times.  No trouble sleeping. No trouble breathing or cough.  No chest or abdominal pain.  No dysuria or urinary frequency.  No incontinence.  No fever.    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  Review of Systems     Objective:   Physical Exam  GEN: Alert & Oriented, No acute distress CV:  Regular Rate & Rhythm, no murmur Respiratory:  Normal work of breathing, CTAB Abd:  + BS, soft, no tenderness to palpation Ext: no pre-tibial edema Neuro:  cN 2-12 grossly normal.  Strength Upper and lower extremities 5/5 bilaterally.  Patella reflexes equal bilaterally, no clonus.  Neg pronator drift.  Gait slow, but not ataxic. Alert and oriented, able to quickly tell me corerct date, location, president.   No proximal muscle weakness, no temporal artery tenderness.      Assessment & Plan:

## 2012-01-17 NOTE — Patient Instructions (Addendum)
Will do labwork to check for anemia and your thyroid  Work on eating meals and snacks regularly.  If you continue to feel tired, please come back for a recheck

## 2012-01-18 NOTE — Assessment & Plan Note (Addendum)
Mild fatigue without other localizing signs or concerning physical exam.  Encouraged her to remember to eat regulalry, as she attributes this recent change, but no weight change per our records.  Advised to conitue to her normal routine.  Will check TSH, CBC, CMET, UA  Update:  Labs show mild hyponatremia, mild normocytic anemia both of which have been intermittent over the past few years.  Anemia workup in 2012 showed normal ferritin, folic acid, b12.  Do not think  These likley represent any significant change to suggest cause of fatigue.  Will continue to monitor

## 2012-01-26 ENCOUNTER — Telehealth: Payer: Self-pay | Admitting: Family Medicine

## 2012-01-26 NOTE — Telephone Encounter (Signed)
Pt is asking for results of her labs - she is going out of town on Sat. And wants to know before then.  pls let her know.

## 2012-01-26 NOTE — Telephone Encounter (Signed)
Will forward to Dr. Chamberlain 

## 2012-01-27 NOTE — Telephone Encounter (Signed)
Called Laurie Murphy, let her know all her labs were stable- she has had mild normocytic anemia in the past, and mild hyponatremia, but these numbers are not changed.  Otherwise, her kidney function, liver tests, blood sugar, urine, electrolytes were fine.  I let her know if she feels like traveling for the Holidays I do not see any reason not to.  She voices understanding.

## 2012-06-04 ENCOUNTER — Other Ambulatory Visit: Payer: Self-pay | Admitting: Family Medicine

## 2012-08-29 ENCOUNTER — Other Ambulatory Visit: Payer: Self-pay | Admitting: Family Medicine

## 2012-08-29 DIAGNOSIS — I10 Essential (primary) hypertension: Secondary | ICD-10-CM

## 2012-08-30 ENCOUNTER — Other Ambulatory Visit: Payer: Self-pay | Admitting: Family Medicine

## 2013-02-21 ENCOUNTER — Other Ambulatory Visit: Payer: Self-pay | Admitting: Family Medicine

## 2013-02-22 NOTE — Telephone Encounter (Signed)
Please call and have her schedule an apt. I will give her a refill to last 2 months, but needs to been seen before additional refills given.   Thanks,  Ryerson IncJimmy

## 2013-02-22 NOTE — Telephone Encounter (Signed)
Pt informed.  She is about to go out of town and will make an appt when she comes back in. Fleeger, Maryjo RochesterJessica Dawn

## 2013-04-01 ENCOUNTER — Encounter: Payer: Self-pay | Admitting: *Deleted

## 2013-04-01 ENCOUNTER — Encounter: Payer: Medicare Other | Admitting: *Deleted

## 2013-04-01 DIAGNOSIS — R34 Anuria and oliguria: Secondary | ICD-10-CM

## 2013-04-01 LAB — POCT URINALYSIS DIPSTICK
Bilirubin, UA: NEGATIVE
Glucose, UA: NEGATIVE
Ketones, UA: NEGATIVE
NITRITE UA: NEGATIVE
PROTEIN UA: NEGATIVE
UROBILINOGEN UA: 0.2
pH, UA: 6

## 2013-04-01 LAB — POCT UA - MICROSCOPIC ONLY

## 2013-04-01 NOTE — Progress Notes (Signed)
   Pt walked into clinic this afternoon complaining of low pressure.  Pt stated she took her blood pressure on wrist area and it read low.  BP took manually 140/80.  Pt insisted on seeing a doctor for a check up today.  Precepted with Dr. Mauricio PoBreen.  Dr.  Mauricio PoBreen spoke with pt.  Pt denied having any chest pain, SOB or dizziness today.  Pt alert and oriented x 3.  Pt stating complaining of low urine without pain and something in her ear. Per Dr. Mauricio PoBreen, pt has ear wax in both ears, UA completed today.  Pt has [facility] since [facility].  Advised pt to schedule appt for office visit very soon for a physical.  Clovis PuMartin, Tymarion Everard L, RN

## 2013-04-01 NOTE — Addendum Note (Signed)
Addended by: SwazilandJORDAN, Gillian Kluever on: 04/01/2013 06:13 PM   Modules accepted: Orders

## 2013-04-12 ENCOUNTER — Encounter: Payer: Self-pay | Admitting: Family Medicine

## 2013-04-12 ENCOUNTER — Ambulatory Visit (INDEPENDENT_AMBULATORY_CARE_PROVIDER_SITE_OTHER): Payer: Medicare Other | Admitting: Family Medicine

## 2013-04-12 ENCOUNTER — Ambulatory Visit: Payer: Medicare Other | Admitting: Family Medicine

## 2013-04-12 VITALS — BP 150/74 | HR 73 | Temp 97.8°F | Ht 64.0 in | Wt 122.0 lb

## 2013-04-12 DIAGNOSIS — H6120 Impacted cerumen, unspecified ear: Secondary | ICD-10-CM

## 2013-04-12 DIAGNOSIS — I10 Essential (primary) hypertension: Secondary | ICD-10-CM

## 2013-04-12 MED ORDER — AMLODIPINE BESYLATE 10 MG PO TABS: 10.0000 mg | ORAL_TABLET | Freq: Every day | ORAL | Status: DC

## 2013-04-12 NOTE — Patient Instructions (Signed)
It was great seeing you today.   1. Use mineral oil: 1-2 drops per ear to help prevent build up of wax 2. Take any multivitamin also as it has Vitamin D and Calcium   Please bring all your medications to every doctors visit  Sign up for My Chart to have easy access to your labs results, and communication with your Primary care physician.  Next Appointment  Please call to make an appointment with Dr Gayla DossJoyner in 1 years, or sooner if needed   I look forward to talking with you again at our next visit. If you have any questions or concerns before then, please call the clinic at [phone].  Take Care,   Dr Wenda LowJames Leiliana Foody

## 2013-04-12 NOTE — Assessment & Plan Note (Addendum)
Mildly elevated BP today on arrival with repeat BP wnl - Denies CP, SOB, LE edema - Continue Norvasc and ASA 81 mg

## 2013-04-12 NOTE — Progress Notes (Signed)
  Patient name: Laurie Murphy MRN [id number]  Date of birth: Feb 12, 1912  CC & HPI:  Laurie Murphy is a 80 y.o. female presenting today for HTN check-up and decreased hearing in left ear.   CHRONIC HYPERTENSION BP Readings from Last 3 Encounters:  04/12/13 163/71  04/01/13 140/80  01/17/12 143/83    Disease Monitoring  Chest pain: no   Dyspnea: no   Claudication: no  Medication compliance/financial difficulties: no  Medication Side Effects: NO   Decreased Hearing - She was seen recently in clinic for this, was told she had wax impaction and to follow-up with her PCP. She has been using hydrogen peroxide with minimal improvement  ROS: See HPI above otherwise negative.  Medical & Surgical Hx:  Reviewed.  Medications & Allergies: Reviewed & Updated - see associated section Social History: Reviewed:   Objective Findings:  Vitals: BP 163/71  Pulse 73  Temp(Src) 97.8 F (36.6 C) (Oral)  Ht 5\' 4"  (1.626 m)  Wt 122 lb (55.339 kg)  BMI 20.93 kg/m2  Gen: NAD Ears: Wax impaction in b/l ears unable to view TM - Clear TMs w/o erythema s/p manual wax removal  CV: RRR w/o m/r/g, pulses +2 b/l; No LE edema Resp: CTAB w/ normal respiratory effort   Assessment & Plan:   Please See Problem Focused Assessment & Plan

## 2014-04-27 ENCOUNTER — Other Ambulatory Visit: Payer: Self-pay | Admitting: Family Medicine

## 2014-04-28 NOTE — Telephone Encounter (Signed)
Letter mailed. Jazmin Hartsell,CMA  

## 2014-04-28 NOTE — Telephone Encounter (Signed)
Please call and have her schedule PCP apt prior to additional refills. I have refilled her BP meds today for 2 months.

## 2014-04-28 NOTE — Telephone Encounter (Signed)
Refill request. Will forward to PCP for review. Makara Lanzo, CMA. 

## 2014-05-10 ENCOUNTER — Other Ambulatory Visit: Payer: Self-pay | Admitting: Family Medicine

## 2014-05-10 DIAGNOSIS — I10 Essential (primary) hypertension: Secondary | ICD-10-CM

## 2015-01-24 ENCOUNTER — Other Ambulatory Visit: Payer: Self-pay | Admitting: Family Medicine

## 2015-03-12 ENCOUNTER — Telehealth: Payer: Self-pay | Admitting: *Deleted

## 2015-03-12 NOTE — Telephone Encounter (Addendum)
Nettie Elm from [facility] [phone]) called asking for prior authorization for brand name norvasc.  Patient has new insurance: [employer]. ID [id number] ICD-10 I10 Mikle Bosworth at [facility] Help Desk [phone]) notified and PA received for 12 months (03/12/2015 to 03/11/2016) Approval # [id number] Nettie Elm at [facility] notified and med went through. Fredderick Severance, RN

## 2015-10-17 ENCOUNTER — Other Ambulatory Visit: Payer: Self-pay | Admitting: Family Medicine

## 2015-10-19 NOTE — Telephone Encounter (Signed)
Rx filled. Patient needs follow up appointment.  Laurie Murphy M. [name], MD [facility] Resident PGY-3 10/19/2015 8:39 AM

## 2016-05-11 ENCOUNTER — Observation Stay (HOSPITAL_COMMUNITY): Payer: Medicare Other

## 2016-05-11 ENCOUNTER — Encounter (HOSPITAL_COMMUNITY): Payer: Self-pay | Admitting: Emergency Medicine

## 2016-05-11 ENCOUNTER — Inpatient Hospital Stay (HOSPITAL_COMMUNITY)
Admission: EM | Admit: 2016-05-11 | Discharge: 2016-05-14 | DRG: 086 | Disposition: A | Discharge status: Home Health | Payer: Medicare Other | Attending: Internal Medicine | Admitting: Internal Medicine

## 2016-05-11 ENCOUNTER — Emergency Department (HOSPITAL_COMMUNITY): Payer: Medicare Other

## 2016-05-11 DIAGNOSIS — I272 Pulmonary hypertension, unspecified: Secondary | ICD-10-CM | POA: Diagnosis present

## 2016-05-11 DIAGNOSIS — I471 Supraventricular tachycardia: Secondary | ICD-10-CM | POA: Diagnosis not present

## 2016-05-11 DIAGNOSIS — Z66 Do not resuscitate: Secondary | ICD-10-CM | POA: Diagnosis present

## 2016-05-11 DIAGNOSIS — I10 Essential (primary) hypertension: Secondary | ICD-10-CM | POA: Diagnosis present

## 2016-05-11 DIAGNOSIS — S0101XA Laceration without foreign body of scalp, initial encounter: Secondary | ICD-10-CM | POA: Diagnosis not present

## 2016-05-11 DIAGNOSIS — I350 Nonrheumatic aortic (valve) stenosis: Secondary | ICD-10-CM | POA: Diagnosis not present

## 2016-05-11 DIAGNOSIS — S064X0A Epidural hemorrhage without loss of consciousness, initial encounter: Secondary | ICD-10-CM | POA: Diagnosis not present

## 2016-05-11 DIAGNOSIS — I4891 Unspecified atrial fibrillation: Secondary | ICD-10-CM | POA: Diagnosis present

## 2016-05-11 DIAGNOSIS — Z9842 Cataract extraction status, left eye: Secondary | ICD-10-CM | POA: Diagnosis not present

## 2016-05-11 DIAGNOSIS — I4589 Other specified conduction disorders: Secondary | ICD-10-CM | POA: Diagnosis present

## 2016-05-11 DIAGNOSIS — I739 Peripheral vascular disease, unspecified: Secondary | ICD-10-CM | POA: Diagnosis present

## 2016-05-11 DIAGNOSIS — I700 Atherosclerosis of aorta: Secondary | ICD-10-CM | POA: Diagnosis present

## 2016-05-11 DIAGNOSIS — S066X0A Traumatic subarachnoid hemorrhage without loss of consciousness, initial encounter: Secondary | ICD-10-CM | POA: Diagnosis not present

## 2016-05-11 DIAGNOSIS — Z9841 Cataract extraction status, right eye: Secondary | ICD-10-CM | POA: Diagnosis not present

## 2016-05-11 DIAGNOSIS — W010XXA Fall on same level from slipping, tripping and stumbling without subsequent striking against object, initial encounter: Secondary | ICD-10-CM | POA: Diagnosis not present

## 2016-05-11 DIAGNOSIS — R51 Headache: Secondary | ICD-10-CM | POA: Diagnosis not present

## 2016-05-11 DIAGNOSIS — Z823 Family history of stroke: Secondary | ICD-10-CM

## 2016-05-11 DIAGNOSIS — Z8619 Personal history of other infectious and parasitic diseases: Secondary | ICD-10-CM

## 2016-05-11 DIAGNOSIS — I119 Hypertensive heart disease without heart failure: Secondary | ICD-10-CM | POA: Diagnosis present

## 2016-05-11 DIAGNOSIS — Z7982 Long term (current) use of aspirin: Secondary | ICD-10-CM | POA: Diagnosis not present

## 2016-05-11 DIAGNOSIS — S064X9A Epidural hemorrhage with loss of consciousness of unspecified duration, initial encounter: Secondary | ICD-10-CM | POA: Diagnosis not present

## 2016-05-11 DIAGNOSIS — I4892 Unspecified atrial flutter: Secondary | ICD-10-CM | POA: Diagnosis not present

## 2016-05-11 DIAGNOSIS — M4312 Spondylolisthesis, cervical region: Secondary | ICD-10-CM | POA: Diagnosis present

## 2016-05-11 DIAGNOSIS — I483 Typical atrial flutter: Secondary | ICD-10-CM | POA: Diagnosis not present

## 2016-05-11 DIAGNOSIS — M479 Spondylosis, unspecified: Secondary | ICD-10-CM | POA: Diagnosis not present

## 2016-05-11 DIAGNOSIS — S064XAA Epidural hemorrhage with loss of consciousness status unknown, initial encounter: Secondary | ICD-10-CM | POA: Diagnosis present

## 2016-05-11 LAB — BASIC METABOLIC PANEL
ANION GAP: 6 (ref 5–15)
BUN: 25 mg/dL — ABNORMAL HIGH (ref 6–20)
CALCIUM: 8.5 mg/dL — AB (ref 8.9–10.3)
CO2: 28 mmol/L (ref 22–32)
Chloride: 107 mmol/L (ref 101–111)
Creatinine, Ser: 1.06 mg/dL — ABNORMAL HIGH (ref 0.44–1.00)
GFR calc non Af Amer: 41 mL/min — ABNORMAL LOW (ref >60)
GFR, EST AFRICAN AMERICAN: 48 mL/min — AB (ref >60)
GLUCOSE: 105 mg/dL — AB (ref 65–99)
POTASSIUM: 4 mmol/L (ref 3.5–5.1)
Sodium: 141 mmol/L (ref 135–145)

## 2016-05-11 LAB — CBC WITH DIFFERENTIAL/PLATELET
BASOS PCT: 0 %
Basophils Absolute: 0 10*3/uL (ref 0.0–0.1)
EOS ABS: 0 10*3/uL (ref 0.0–0.7)
EOS PCT: 0 %
HCT: 35.7 % — ABNORMAL LOW (ref 36.0–46.0)
Hemoglobin: 11.8 g/dL — ABNORMAL LOW (ref 12.0–15.0)
LYMPHS ABS: 1.2 10*3/uL (ref 0.7–4.0)
Lymphocytes Relative: 18 %
MCH: 29.1 pg (ref 26.0–34.0)
MCHC: 33.1 g/dL (ref 30.0–36.0)
MCV: 88.1 fL (ref 78.0–100.0)
MONOS PCT: 6 %
Monocytes Absolute: 0.4 10*3/uL (ref 0.1–1.0)
Neutro Abs: 5.1 10*3/uL (ref 1.7–7.7)
Neutrophils Relative %: 76 %
PLATELETS: 150 10*3/uL (ref 150–400)
RBC: 4.05 MIL/uL (ref 3.87–5.11)
RDW: 15.8 % — AB (ref 11.5–15.5)
WBC: 6.8 10*3/uL (ref 4.0–10.5)

## 2016-05-11 LAB — PROTIME-INR
INR: 1.01
Prothrombin Time: 13.4 seconds (ref 11.4–15.2)

## 2016-05-11 LAB — MRSA PCR SCREENING: MRSA by PCR: NEGATIVE

## 2016-05-11 LAB — T4, FREE: Free T4: 1.14 ng/dL — ABNORMAL HIGH (ref 0.61–1.12)

## 2016-05-11 LAB — APTT: aPTT: 29 seconds (ref 24–36)

## 2016-05-11 LAB — TSH: TSH: 2.116 u[IU]/mL (ref 0.350–4.500)

## 2016-05-11 MED ORDER — TRAVOPROST (BAK FREE) 0.004 % OP SOLN
1.0000 [drp] | Freq: Every day | OPHTHALMIC | Status: DC
  Administered 2016-05-12 – 2016-05-13 (×2): 1 [drp] via OPHTHALMIC

## 2016-05-11 MED ORDER — SODIUM CHLORIDE 0.9 % IV SOLN: 250.0000 mL | INTRAVENOUS | Status: DC | PRN

## 2016-05-11 MED ORDER — NON FORMULARY
1.0000 [drp] | Freq: Every day | Status: DC
  Administered 2016-05-11: 1 [drp] via OPHTHALMIC

## 2016-05-11 MED ORDER — AMLODIPINE BESYLATE 10 MG PO TABS
10.0000 mg | ORAL_TABLET | Freq: Every day | ORAL | Status: DC
  Administered 2016-05-11 – 2016-05-14 (×4): 10 mg via ORAL
  Filled 2016-05-11 (×3): qty 1
  Filled 2016-05-11: qty 2

## 2016-05-11 MED ORDER — CARBOXYMETHYLCELLULOSE SODIUM 0.5 % OP SOLN: 1.0000 [drp] | Freq: Three times a day (TID) | OPHTHALMIC | Status: DC | PRN

## 2016-05-11 MED ORDER — LIDOCAINE-EPINEPHRINE (PF) 2 %-1:200000 IJ SOLN
10.0000 mL | Freq: Once | INTRAMUSCULAR | Status: AC
  Administered 2016-05-11: 10 mL
  Filled 2016-05-11: qty 20

## 2016-05-11 MED ORDER — METOPROLOL TARTRATE 5 MG/5ML IV SOLN: 2.5000 mg | INTRAVENOUS | Status: DC | PRN

## 2016-05-11 MED ORDER — SODIUM CHLORIDE 0.9 % IV SOLN
Freq: Once | INTRAVENOUS | Status: AC
  Administered 2016-05-11: 16:00:00 via INTRAVENOUS

## 2016-05-11 MED ORDER — METOPROLOL TARTRATE 5 MG/5ML IV SOLN: 2.5000 mg | Freq: Four times a day (QID) | INTRAVENOUS | Status: DC | PRN

## 2016-05-11 MED ORDER — ONDANSETRON HCL 4 MG PO TABS: 4.0000 mg | ORAL_TABLET | Freq: Four times a day (QID) | ORAL | Status: DC | PRN

## 2016-05-11 MED ORDER — ONDANSETRON HCL 4 MG/2ML IJ SOLN: 4.0000 mg | Freq: Four times a day (QID) | INTRAMUSCULAR | Status: DC | PRN

## 2016-05-11 MED ORDER — HYDRALAZINE HCL 20 MG/ML IJ SOLN
10.0000 mg | Freq: Once | INTRAMUSCULAR | Status: AC
  Administered 2016-05-11: 10 mg via INTRAVENOUS
  Filled 2016-05-11: qty 1

## 2016-05-11 MED ORDER — METOPROLOL TARTRATE 5 MG/5ML IV SOLN
2.5000 mg | INTRAVENOUS | Status: DC | PRN
  Administered 2016-05-13: 2.5 mg via INTRAVENOUS
  Filled 2016-05-11 (×2): qty 5

## 2016-05-11 MED ORDER — SODIUM CHLORIDE 0.9% FLUSH
3.0000 mL | Freq: Two times a day (BID) | INTRAVENOUS | Status: DC
  Administered 2016-05-11 – 2016-05-14 (×5): 3 mL via INTRAVENOUS

## 2016-05-11 MED ORDER — POLYVINYL ALCOHOL 1.4 % OP SOLN
1.0000 [drp] | Freq: Three times a day (TID) | OPHTHALMIC | Status: DC | PRN
  Filled 2016-05-11 (×2): qty 15

## 2016-05-11 MED ORDER — SODIUM CHLORIDE 0.9% FLUSH: 3.0000 mL | INTRAVENOUS | Status: DC | PRN

## 2016-05-11 MED ORDER — ACETAMINOPHEN 650 MG RE SUPP: 650.0000 mg | Freq: Four times a day (QID) | RECTAL | Status: DC | PRN

## 2016-05-11 MED ORDER — ACETAMINOPHEN 325 MG PO TABS: 650.0000 mg | ORAL_TABLET | Freq: Four times a day (QID) | ORAL | Status: DC | PRN

## 2016-05-11 MED ORDER — METOPROLOL TARTRATE 5 MG/5ML IV SOLN
2.5000 mg | Freq: Once | INTRAVENOUS | Status: AC
  Administered 2016-05-11: 2.5 mg via INTRAVENOUS
  Filled 2016-05-11: qty 5

## 2016-05-11 MED ORDER — LATANOPROST 0.005 % OP SOLN
1.0000 [drp] | Freq: Every day | OPHTHALMIC | Status: DC
  Filled 2016-05-11: qty 2.5

## 2016-05-11 NOTE — ED Notes (Signed)
EDP aware of patient's vital signs. 

## 2016-05-11 NOTE — ED Triage Notes (Signed)
Per EMS, patient was holding blanket and tripped over blanket and fell while walking. Patient hit back of head. Laceration noted. Bleeding controlled at this time. Denies loss of consciousness. Denies blood thinner use. Patient is from home.

## 2016-05-11 NOTE — ED Provider Notes (Signed)
[facility]-EMERGENCY DEPT Provider Note   CSN: [id number] Arrival date & time: 05/11/16  7829     History   Chief Complaint Chief Complaint  Patient presents with  . Fall  . Head Laceration    HPI Laurie Murphy is a 83 y.o. female.  The history is provided by the patient.  Fall  This is a new problem. The current episode started 1 to 2 hours ago. The problem occurs rarely. The problem has not changed since onset.Pertinent negatives include no chest pain, no abdominal pain, no headaches and no shortness of breath. Nothing aggravates the symptoms. Nothing relieves the symptoms. She has tried nothing for the symptoms.  Head Laceration  This is a new problem. The current episode started 1 to 2 hours ago. The problem occurs rarely. The problem has not changed since onset.Pertinent negatives include no chest pain, no abdominal pain, no headaches and no shortness of breath. Nothing aggravates the symptoms. Nothing relieves the symptoms. She has tried nothing for the symptoms.   83 year old female who presents after mechanical fall. She has a history of hypertension. Takes 2 baby aspirin daily but no other blood thinners. States that she has been in her usual state of health and today was carrying a blanket in her home. States that she tripped over the bottom of her blanket and fell backwards onto her head. She didn't have loss of consciousness or any syncopal near-syncopal's prior to fall. She denies any headache, nausea or vomiting, vision or speech changes, confusion, focal numbness or weakness, or difficulty walking. States that she was able to get herself up after her fall and ambulate. Sustained a laceration to the back of her head. Denies any other injuries.   Past Medical History:  Diagnosis Date  . Hypertension   . Shingles     Patient Active Problem List   Diagnosis Date Noted  . Epidural hematoma (HCC) 05/11/2016  . Health care maintenance 09/03/2011  . Protein-calorie  malnutrition (HCC) 01/03/2011  . Lower leg DVT (deep venous thromboembolism), chronic (HCC) 12/10/2010  . Atrial fibrillation (HCC) 10/02/2010  . OSTEOPOROSIS 10/25/2007  . HYPERTENSION, BENIGN SYSTEMIC 04/06/2006    History reviewed. No pertinent surgical history.  OB History    No data available       Home Medications    Prior to Admission medications   Medication Sig Start Date End Date Taking? Authorizing Provider  aspirin EC 325 MG tablet Take 325 mg by mouth daily.   Yes Historical Provider, MD  carboxymethylcellulose (REFRESH PLUS) 0.5 % SOLN Place 1 drop into both eyes 3 (three) times daily as needed (for dry eyes).   Yes Historical Provider, MD  NORVASC 10 MG tablet TAKE 1 TABLET BY MOUTH EVERY DAY 10/19/15  Yes Ardith Dark, MD  Travoprost, BAK Free, (TRAVATAMN) 0.004 % SOLN ophthalmic solution Place 1 drop into the left eye at bedtime.     Yes Historical Provider, MD    Family History History reviewed. No pertinent family history.  Social History Social History  Substance Use Topics  . Smoking status: Never Smoker  . Smokeless tobacco: Never Used  . Alcohol use No     Allergies   Patient has no known allergies.   Review of Systems Review of Systems  Constitutional: Negative for fever.  HENT: Negative for congestion.   Respiratory: Negative for shortness of breath.   Cardiovascular: Negative for chest pain.  Gastrointestinal: Negative for abdominal pain.  Genitourinary: Negative for difficulty urinating.  Musculoskeletal:  Negative for back pain and neck pain.  Skin: Positive for wound.  Allergic/Immunologic: Negative for immunocompromised state.  Neurological: Negative for headaches.  Psychiatric/Behavioral: Negative for confusion.  All other systems reviewed and are negative.    Physical Exam Updated Vital Signs BP (!) 167/96   Pulse 69   Temp 98 F (36.7 C) (Oral)   Resp 16   Ht  (1.651 m)   Wt 120 lb (54.4 kg)   SpO2 99%   BMI  19.97 kg/m   Physical Exam Physical Exam  Nursing note and vitals reviewed. Constitutional: Well developed, well nourished, non-toxic, and in no acute distress Head: Normocephalic. 3 cm posterior scalp laceration with bleeding controlled.  Mouth/Throat: Oropharynx is clear and moist.  Neck: Normal range of motion. Neck supple.  no cervical spine tenderness Cardiovascular: Normal rate and regular rhythm.   Pulmonary/Chest: Effort normal and breath sounds normal.  no chest wall tenderness Abdominal: Soft. There is no tenderness. There is no rebound and no guarding.  Musculoskeletal: Normal range of motion.  no TLS spine tenderness. No deformities. Normal range of motion of all 4 extremities  Neurological: Alert, no facial droop, fluent speech, moves all extremities symmetrically, sensation to light touch intact throughout, pupils equal and reactive to light, extraocular movements intact Skin: Skin is warm and dry.  Psychiatric: Cooperative   ED Treatments / Results  Labs (all labs ordered are listed, but only abnormal results are displayed) Labs Reviewed  CBC WITH DIFFERENTIAL/PLATELET - Abnormal; Notable for the following:       Result Value   Hemoglobin 11.8 (*)    HCT 35.7 (*)    RDW 15.8 (*)    All other components within normal limits  BASIC METABOLIC PANEL - Abnormal; Notable for the following:    Glucose, Bld 105 (*)    BUN 25 (*)    Creatinine, Ser 1.06 (*)    Calcium 8.5 (*)    GFR calc non Af Amer 41 (*)    GFR calc Af Amer 48 (*)    All other components within normal limits  PROTIME-INR  APTT    EKG  EKG Interpretation  Date/Time:  Wednesday May 11 2016 12:22:34 EDT Ventricular Rate:  145 PR Interval:    QRS Duration: 92 QT Interval:  322 QTC Calculation: 501 R Axis:   -63 Text Interpretation:  Supraventricular tachycardia Left anterior fascicular block RSR' in V1 or V2, right VCD or RVH Repolarization abnormality, prob rate related Confirmed by Hadia Minier MD,  Annabelle Harman ([id number]) on 05/11/2016 12:27:31 PM Also confirmed by Shandie Bertz MD, Valmai Vandenberghe [phone]), editor Misty Stanley [phone])  on 05/11/2016 12:55:11 PM       Radiology Ct Head Wo Contrast  Result Date: 05/11/2016 CLINICAL DATA:  Pain following fall EXAM: CT HEAD WITHOUT CONTRAST CT CERVICAL SPINE WITHOUT CONTRAST TECHNIQUE: Multidetector CT imaging of the head and cervical spine was performed following the standard protocol without intravenous contrast. Multiplanar CT image reconstructions of the cervical spine were also generated. COMPARISON:  Head CT October 01, 2010 FINDINGS: CT HEAD FINDINGS Brain: There is stable mild atrophy. Asymmetric atrophy in the left cerebellar region compared to the right is stable. There is an epidural hematoma in the left parietal region measuring 2.9 cm from superior to inferior, 2.6 cm from anterior to posterior, and 0.9 cm from right to left. No other extra-axial fluid collections are evident. There is no intra-axial mass or intra-axial hemorrhage. There is no midline shift. There is patchy small vessel disease  in the centra semiovale bilaterally. Elsewhere gray-Rodino compartments appear unremarkable. No evident acute infarct. Vascular: There is no hyperdense vessel. There is extensive calcification in both carotid siphon regions. Skull: Bony calvarium appears intact. Sinuses/Orbits: There is mucosal thickening in several ethmoid air cells bilaterally. There is mild mucosal thickening in the posterior right maxillary region. Other visualized paranasal sinuses are clear. Orbits appear symmetric bilaterally. Other: Mastoid air cells are clear. CT CERVICAL SPINE FINDINGS Alignment: There is mild levoscoliosis. There is 3 mm of anterolisthesis of C4 on C5. No other appreciable spondylolisthesis. Skull base and vertebrae: Skull base and craniocervical junction regions appear unremarkable. No fracture is evident. There are no blastic or lytic bone lesions. Bones are osteoporotic. Soft tissues  and spinal canal: Prevertebral soft tissues and predental space regions are normal. There is no paraspinous lesion. There is no evident cord or canal hematoma. Disc levels: There is marked disc space narrowing at C5-6 and C6-7. There is moderately severe disc space narrowing at C4-5, C7-T1, and the upper thoracic regions. There is multilevel facet hypertrophy. There is exit foraminal narrowing due to facet hypertrophy at C3-4 on the right, at C4-5 bilaterally, at C5-6 on the right, and at C6-7 on the right. There is no frank disc extrusion. Upper chest: There is aortic atherosclerosis. There is fibrosis in the visualized upper lung zones. Other: There is carotid artery calcification bilaterally. There is also subclavian artery calcification bilaterally. IMPRESSION: CT head: Left parietal epidural hematoma measuring 2.9 x 2.6 x 0.9 cm. No other extra-axial hemorrhage or fluid collection. No intra-axial hemorrhage. There is atrophy, stable, with stable periventricular small vessel disease. There are foci of arterial vascular calcification. There are foci of mild paranasal sinus disease. CT cervical spine: No demonstrable fracture. Spondylolisthesis at C4-5 is felt to be due to underlying spondylosis. There is extensive multilevel arthropathy. There is aortic atherosclerosis as well as calcification in both carotid and subclavian arteries. There is fibrotic change in the upper lung zones. Critical Value/emergent results were called by telephone at the time of interpretation on 05/11/2016 at 11:43 am to Dr. Crista Curb , who verbally acknowledged these results. Electronically Signed   By: Bretta Bang III M.D.   On: 05/11/2016 11:43   Ct Cervical Spine Wo Contrast  Result Date: 05/11/2016 CLINICAL DATA:  Pain following fall EXAM: CT HEAD WITHOUT CONTRAST CT CERVICAL SPINE WITHOUT CONTRAST TECHNIQUE: Multidetector CT imaging of the head and cervical spine was performed following the standard protocol without  intravenous contrast. Multiplanar CT image reconstructions of the cervical spine were also generated. COMPARISON:  Head CT October 01, 2010 FINDINGS: CT HEAD FINDINGS Brain: There is stable mild atrophy. Asymmetric atrophy in the left cerebellar region compared to the right is stable. There is an epidural hematoma in the left parietal region measuring 2.9 cm from superior to inferior, 2.6 cm from anterior to posterior, and 0.9 cm from right to left. No other extra-axial fluid collections are evident. There is no intra-axial mass or intra-axial hemorrhage. There is no midline shift. There is patchy small vessel disease in the centra semiovale bilaterally. Elsewhere gray-Ruzich compartments appear unremarkable. No evident acute infarct. Vascular: There is no hyperdense vessel. There is extensive calcification in both carotid siphon regions. Skull: Bony calvarium appears intact. Sinuses/Orbits: There is mucosal thickening in several ethmoid air cells bilaterally. There is mild mucosal thickening in the posterior right maxillary region. Other visualized paranasal sinuses are clear. Orbits appear symmetric bilaterally. Other: Mastoid air cells are clear. CT CERVICAL SPINE  FINDINGS Alignment: There is mild levoscoliosis. There is 3 mm of anterolisthesis of C4 on C5. No other appreciable spondylolisthesis. Skull base and vertebrae: Skull base and craniocervical junction regions appear unremarkable. No fracture is evident. There are no blastic or lytic bone lesions. Bones are osteoporotic. Soft tissues and spinal canal: Prevertebral soft tissues and predental space regions are normal. There is no paraspinous lesion. There is no evident cord or canal hematoma. Disc levels: There is marked disc space narrowing at C5-6 and C6-7. There is moderately severe disc space narrowing at C4-5, C7-T1, and the upper thoracic regions. There is multilevel facet hypertrophy. There is exit foraminal narrowing due to facet hypertrophy at C3-4  on the right, at C4-5 bilaterally, at C5-6 on the right, and at C6-7 on the right. There is no frank disc extrusion. Upper chest: There is aortic atherosclerosis. There is fibrosis in the visualized upper lung zones. Other: There is carotid artery calcification bilaterally. There is also subclavian artery calcification bilaterally. IMPRESSION: CT head: Left parietal epidural hematoma measuring 2.9 x 2.6 x 0.9 cm. No other extra-axial hemorrhage or fluid collection. No intra-axial hemorrhage. There is atrophy, stable, with stable periventricular small vessel disease. There are foci of arterial vascular calcification. There are foci of mild paranasal sinus disease. CT cervical spine: No demonstrable fracture. Spondylolisthesis at C4-5 is felt to be due to underlying spondylosis. There is extensive multilevel arthropathy. There is aortic atherosclerosis as well as calcification in both carotid and subclavian arteries. There is fibrotic change in the upper lung zones. Critical Value/emergent results were called by telephone at the time of interpretation on 05/11/2016 at 11:43 am to Dr. Crista Curb , who verbally acknowledged these results. Electronically Signed   By: Bretta Bang III M.D.   On: 05/11/2016 11:43    Procedures Procedures (including critical care time) 05/11/2016 c CRITICAL CARE Performed by: Lavera Guise   Total critical care time: 40 minutes  Critical care time was exclusive of separately billable procedures and treating other patients.  Critical care was necessary to treat or prevent imminent or life-threatening deterioration.  Critical care was time spent personally by me on the following activities: development of treatment plan with patient and/or surrogate as well as nursing, discussions with consultants, evaluation of patient's response to treatment, examination of patient, obtaining history from patient or surrogate, ordering and performing treatments and interventions, ordering and  review of laboratory studies, ordering and review of radiographic studies, pulse oximetry and re-evaluation of patient's condition.  Medications Ordered in ED Medications  lidocaine-EPINEPHrine (XYLOCAINE W/EPI) 2 %-1:200000 (PF) injection 10 mL (10 mLs Infiltration Given by Other 05/11/16 1135)  metoprolol (LOPRESSOR) injection 2.5 mg (2.5 mg Intravenous Given 05/11/16 1242)     Initial Impression / Assessment and Plan / ED Course  I have reviewed the triage vital signs and the nursing notes.  Pertinent labs & imaging results that were available during my care of the patient were reviewed by me and considered in my medical decision making (see chart for details).    83 year old female who presents after mechanical fall. Well appearing, in no acute distress. mentating normally with no focal deficits.   CT head and cspine visualized. With evidence of 3 cm epidural hematoma. Takes baby aspirin but no other anticoagulants. Case is discussed with Dr. Mikal Plane, who does not recommend any procedural or surgical management for this. Did recommend observation in the hospital on medicine service.  During ED course, patient has become very anxious with  her diagnosis, and becomes tachycardic with heart rate in the 140s to 150s. EKG is obtained, showing potential SVT. Received metoprolol 2.5 mg with HR 80-90s. Repeat EKg suggestive of atrial flutter. History of atrial fibrillation by history.   Discussed with family medicine teaching service. Will plan to admit for ongoing management.   Final Clinical Impressions(s) / ED Diagnoses   Final diagnoses:  Typical atrial flutter (HCC)  Epidural hematoma (HCC)    New Prescriptions New Prescriptions   No medications on file     Lavera Guise, MD 05/11/16 1410

## 2016-05-11 NOTE — ED Notes (Signed)
Hospitalist at bedside 

## 2016-05-11 NOTE — ED Notes (Signed)
Pt would not allow me to recollect labs. Pt stated that she only wanted RN that has been working with her to collect labs. Pt stated that she does not like change.

## 2016-05-11 NOTE — H&P (Addendum)
History and Physical    Laurie Murphy HYQ:[id number] DOB: 04/10/1912 DOA: 05/11/2016    PCP: Laurie Doe, MD  Patient coming from: home  Chief Complaint: fall and head laceration.  HPI: Laurie Murphy is a pleasant 83 y.o. female with medical history significant of HTN who lives alone and fell when she tripped on a towel she was carrying and hit her head. She came in via EMS to be assessed and was found to have a scalp laceration, an epidural hematoma and A-flutter with RVR. She usually takes 2 baby aspirin a day but took 1 325 mg dose yesterday.  She did not have any head or neck pain, neurological changes, chest pain or palpitations. She was able to get up and walk after the fall. No recent illnesses. Remarkably healthy otherwise.  ED Course: CT head>>  Left parietal epidural hematoma measuring 2.9 x 2.6 x 0.9 cm. SVT in 140s improved after 2.5 mg IV Lopressor to 70-80s- A-fib/ flutter   Review of Systems:  Occasional vaginal itching All other systems reviewed and apart from HPI, are negative.  Past Medical History:  Diagnosis Date  . Hypertension   . Shingles     History reviewed. No pertinent surgical history.  Social History:   reports that she has never smoked. She has never used smokeless tobacco. She reports that she does not drink alcohol or use drugs.  Retired professor of education. No longer drives, lives alone.   No Known Allergies  History reviewed. No pertinent family history.   Prior to Admission medications   Medication Sig Start Date End Date Taking? Authorizing Provider  aspirin EC 325 MG tablet Take 325 mg by mouth daily.   Yes Historical Provider, MD  carboxymethylcellulose (REFRESH PLUS) 0.5 % SOLN Place 1 drop into both eyes 3 (three) times daily as needed (for dry eyes).   Yes Historical Provider, MD  NORVASC 10 MG tablet TAKE 1 TABLET BY MOUTH EVERY DAY 10/19/15  Yes Ardith Dark, MD  Travoprost, BAK Free, (TRAVATAMN) 0.004 % SOLN ophthalmic  solution Place 1 drop into the left eye at bedtime.     Yes Historical Provider, MD    Physical Exam: Vitals:   05/11/16 1413 05/11/16 1430 05/11/16 1500 05/11/16 1530  BP: (!) 179/87 (!) 195/92 (!) 149/84 (!) 164/105  Pulse: 73 74 67 85  Resp: Temp:      TempSrc:      SpO2: 100% 99% 99% 99%  Weight:      Height:          Constitutional: NAD, calm, comfortable Eyes: PERTLA, lids and conjunctivae normal ENMT: Mucous membranes are moist. Posterior pharynx clear of any exudate or lesions. Normal dentition.  Neck: normal, supple, no masses, no thyromegaly Respiratory: clear to auscultation bilaterally, no wheezing, no crackles. Normal respiratory effort. No accessory muscle use.  Cardiovascular: S1 & S2 heard, IIRR, no murmurs / rubs / gallops. No extremity edema. 2+ pedal pulses. No carotid bruits.  Abdomen: No distension, no tenderness, no masses palpated. No hepatosplenomegaly. Bowel sounds normal.  Musculoskeletal: no clubbing / cyanosis. No joint deformity upper and lower extremities. Good ROM, no contractures. Normal muscle tone.  Skin: no rashes, stapled laceration on back of head Neurologic: CN 2-12 grossly intact. Sensation intact, DTR normal. Strength 5/5 in all 4 limbs.  Psychiatric: Normal judgment and insight. Alert and oriented x 3. Normal mood.     Labs on Admission: I have personally reviewed following  labs and imaging studies  CBC:  Recent Labs Lab 05/11/16 1213  WBC 6.8  NEUTROABS 5.1  HGB 11.8*  HCT 35.7*  MCV 88.1  PLT 150   Basic Metabolic Panel:  Recent Labs Lab 05/11/16 1321  NA 141  K 4.0  CL 107  CO2 28  GLUCOSE 105*  BUN 25*  CREATININE 1.06*  CALCIUM 8.5*   GFR: Estimated Creatinine Clearance: 23 mL/min (A) (by C-G formula based on SCr of 1.06 mg/dL (H)). Liver Function Tests: No results for input(s): AST, ALT, ALKPHOS, BILITOT, PROT, ALBUMIN in the last 168 hours. No results for input(s): LIPASE, AMYLASE in the last  168 hours. No results for input(s): AMMONIA in the last 168 hours. Coagulation Profile:  Recent Labs Lab 05/11/16 1213  INR 1.01   Cardiac Enzymes: No results for input(s): CKTOTAL, CKMB, CKMBINDEX, TROPONINI in the last 168 hours. BNP (last 3 results) No results for input(s): PROBNP in the last 8760 hours. HbA1C: No results for input(s): HGBA1C in the last 72 hours. CBG: No results for input(s): GLUCAP in the last 168 hours. Lipid Profile: No results for input(s): CHOL, HDL, LDLCALC, TRIG, CHOLHDL, LDLDIRECT in the last 72 hours. Thyroid Function Tests: No results for input(s): TSH, T4TOTAL, FREET4, T3FREE, THYROIDAB in the last 72 hours. Anemia Panel: No results for input(s): VITAMINB12, FOLATE, FERRITIN, TIBC, IRON, RETICCTPCT in the last 72 hours. Urine analysis:    Component Value Date/Time   COLORURINE YELLOW 12/12/2010 1355   APPEARANCEUR CLEAR 12/12/2010 1355   LABSPEC 1.005 12/12/2010 1355   PHURINE 7.5 12/12/2010 1355   GLUCOSEU NEGATIVE 12/12/2010 1355   HGBUR NEGATIVE 12/12/2010 1355   BILIRUBINUR NEG 04/01/2013 1641   KETONESUR NEGATIVE 12/12/2010 1355   PROTEINUR NEG 04/01/2013 1641   PROTEINUR NEGATIVE 12/12/2010 1355   UROBILINOGEN 0.2 04/01/2013 1641   UROBILINOGEN 0.2 12/12/2010 1355   NITRITE NEG 04/01/2013 1641   NITRITE NEGATIVE 12/12/2010 1355   LEUKOCYTESUR small (1+) 04/01/2013 1641   Sepsis Labs: (procalcitonin:4,lacticidven:4) )No results found for this or any previous visit (from the past 240 hour(s)).   Radiological Exams on Admission: Ct Head Wo Contrast  Result Date: 05/11/2016 CLINICAL DATA:  Pain following fall EXAM: CT HEAD WITHOUT CONTRAST CT CERVICAL SPINE WITHOUT CONTRAST TECHNIQUE: Multidetector CT imaging of the head and cervical spine was performed following the standard protocol without intravenous contrast. Multiplanar CT image reconstructions of the cervical spine were also generated. COMPARISON:  Head CT October 01, 2010 FINDINGS: CT HEAD FINDINGS Brain: There is stable mild atrophy. Asymmetric atrophy in the left cerebellar region compared to the right is stable. There is an epidural hematoma in the left parietal region measuring 2.9 cm from superior to inferior, 2.6 cm from anterior to posterior, and 0.9 cm from right to left. No other extra-axial fluid collections are evident. There is no intra-axial mass or intra-axial hemorrhage. There is no midline shift. There is patchy small vessel disease in the centra semiovale bilaterally. Elsewhere gray-Simmer compartments appear unremarkable. No evident acute infarct. Vascular: There is no hyperdense vessel. There is extensive calcification in both carotid siphon regions. Skull: Bony calvarium appears intact. Sinuses/Orbits: There is mucosal thickening in several ethmoid air cells bilaterally. There is mild mucosal thickening in the posterior right maxillary region. Other visualized paranasal sinuses are clear. Orbits appear symmetric bilaterally. Other: Mastoid air cells are clear. CT CERVICAL SPINE FINDINGS Alignment: There is mild levoscoliosis. There is 3 mm of anterolisthesis of C4 on C5. No other appreciable spondylolisthesis. Skull  base and vertebrae: Skull base and craniocervical junction regions appear unremarkable. No fracture is evident. There are no blastic or lytic bone lesions. Bones are osteoporotic. Soft tissues and spinal canal: Prevertebral soft tissues and predental space regions are normal. There is no paraspinous lesion. There is no evident cord or canal hematoma. Disc levels: There is marked disc space narrowing at C5-6 and C6-7. There is moderately severe disc space narrowing at C4-5, C7-T1, and the upper thoracic regions. There is multilevel facet hypertrophy. There is exit foraminal narrowing due to facet hypertrophy at C3-4 on the right, at C4-5 bilaterally, at C5-6 on the right, and at C6-7 on the right. There is no frank disc extrusion. Upper chest: There  is aortic atherosclerosis. There is fibrosis in the visualized upper lung zones. Other: There is carotid artery calcification bilaterally. There is also subclavian artery calcification bilaterally. IMPRESSION: CT head: Left parietal epidural hematoma measuring 2.9 x 2.6 x 0.9 cm. No other extra-axial hemorrhage or fluid collection. No intra-axial hemorrhage. There is atrophy, stable, with stable periventricular small vessel disease. There are foci of arterial vascular calcification. There are foci of mild paranasal sinus disease. CT cervical spine: No demonstrable fracture. Spondylolisthesis at C4-5 is felt to be due to underlying spondylosis. There is extensive multilevel arthropathy. There is aortic atherosclerosis as well as calcification in both carotid and subclavian arteries. There is fibrotic change in the upper lung zones. Critical Value/emergent results were called by telephone at the time of interpretation on 05/11/2016 at 11:43 am to Dr. Crista Curb , who verbally acknowledged these results. Electronically Signed   By: Bretta Bang III M.D.   On: 05/11/2016 11:43   Ct Cervical Spine Wo Contrast  Result Date: 05/11/2016 CLINICAL DATA:  Pain following fall EXAM: CT HEAD WITHOUT CONTRAST CT CERVICAL SPINE WITHOUT CONTRAST TECHNIQUE: Multidetector CT imaging of the head and cervical spine was performed following the standard protocol without intravenous contrast. Multiplanar CT image reconstructions of the cervical spine were also generated. COMPARISON:  Head CT October 01, 2010 FINDINGS: CT HEAD FINDINGS Brain: There is stable mild atrophy. Asymmetric atrophy in the left cerebellar region compared to the right is stable. There is an epidural hematoma in the left parietal region measuring 2.9 cm from superior to inferior, 2.6 cm from anterior to posterior, and 0.9 cm from right to left. No other extra-axial fluid collections are evident. There is no intra-axial mass or intra-axial hemorrhage. There is no  midline shift. There is patchy small vessel disease in the centra semiovale bilaterally. Elsewhere gray-Cutbirth compartments appear unremarkable. No evident acute infarct. Vascular: There is no hyperdense vessel. There is extensive calcification in both carotid siphon regions. Skull: Bony calvarium appears intact. Sinuses/Orbits: There is mucosal thickening in several ethmoid air cells bilaterally. There is mild mucosal thickening in the posterior right maxillary region. Other visualized paranasal sinuses are clear. Orbits appear symmetric bilaterally. Other: Mastoid air cells are clear. CT CERVICAL SPINE FINDINGS Alignment: There is mild levoscoliosis. There is 3 mm of anterolisthesis of C4 on C5. No other appreciable spondylolisthesis. Skull base and vertebrae: Skull base and craniocervical junction regions appear unremarkable. No fracture is evident. There are no blastic or lytic bone lesions. Bones are osteoporotic. Soft tissues and spinal canal: Prevertebral soft tissues and predental space regions are normal. There is no paraspinous lesion. There is no evident cord or canal hematoma. Disc levels: There is marked disc space narrowing at C5-6 and C6-7. There is moderately severe disc space narrowing at C4-5, C7-T1,  and the upper thoracic regions. There is multilevel facet hypertrophy. There is exit foraminal narrowing due to facet hypertrophy at C3-4 on the right, at C4-5 bilaterally, at C5-6 on the right, and at C6-7 on the right. There is no frank disc extrusion. Upper chest: There is aortic atherosclerosis. There is fibrosis in the visualized upper lung zones. Other: There is carotid artery calcification bilaterally. There is also subclavian artery calcification bilaterally. IMPRESSION: CT head: Left parietal epidural hematoma measuring 2.9 x 2.6 x 0.9 cm. No other extra-axial hemorrhage or fluid collection. No intra-axial hemorrhage. There is atrophy, stable, with stable periventricular small vessel disease.  There are foci of arterial vascular calcification. There are foci of mild paranasal sinus disease. CT cervical spine: No demonstrable fracture. Spondylolisthesis at C4-5 is felt to be due to underlying spondylosis. There is extensive multilevel arthropathy. There is aortic atherosclerosis as well as calcification in both carotid and subclavian arteries. There is fibrotic change in the upper lung zones. Critical Value/emergent results were called by telephone at the time of interpretation on 05/11/2016 at 11:43 am to Dr. Crista Curb , who verbally acknowledged these results. Electronically Signed   By: Bretta Bang III M.D.   On: 05/11/2016 11:43    EKG: Independently reviewed. SVT  with QT cof 502- followed by A-flutter in 70s with QTc of 444  Assessment/Plan Principal Problem:   Epidural hematoma - in setting of daily aspirin use- hold aspirin - neuro checks in SDU- repeat CT in AM - ER spoke with Dr Mikal Plane- per ER notes, he recommends observation only  Active Problems:   HYPERTENSION, BENIGN SYSTEMIC - resume Norvasc - PRN Lopressor to control BP and intracranial bleed    Atrial flutter with rapid ventricular response  CHA2DS2-VASc Score at least 3 - no prior history of this - obtain ECHO and TFTs - Lopressor PRN for now- follow to decide if she will need it on a routine basis - cannot place on anticoagulation due to fall  DVT prophylaxis: SCDs  Code Status: DNR  Family Communication:   Disposition Plan: SDU and then home  Consults called: none- ER spoke with Neurosurgery  Admission status: observation    [facility] MD [facility] Pager: [email or web address] Password [email or web address] [phone], please contact night-coverage   05/11/2016, 4:04 PM

## 2016-05-11 NOTE — Progress Notes (Signed)
FPTS Interim Progress Note  Initially accepted patient for admission to [facility]. Discussed patient with attending Dr. Pollie Meyer. Per discussion with Dr. Pollie Meyer, patient will need a step down bed and per protocol cannot be transferred from [facility] to [facility]. Called Dr. Verdie Mosher back as this patient should not be admitted to [facility]. Appreciate Dr. Theodora Blow care of this patient.    Beaulah Dinning, MD 05/11/2016, 2:54 PM PGY-2, [facility] Family Medicine Service pager [phone]

## 2016-05-12 ENCOUNTER — Observation Stay (HOSPITAL_COMMUNITY): Payer: Medicare Other

## 2016-05-12 DIAGNOSIS — I4892 Unspecified atrial flutter: Secondary | ICD-10-CM | POA: Diagnosis not present

## 2016-05-12 DIAGNOSIS — I10 Essential (primary) hypertension: Secondary | ICD-10-CM | POA: Diagnosis not present

## 2016-05-12 DIAGNOSIS — S064X0A Epidural hemorrhage without loss of consciousness, initial encounter: Secondary | ICD-10-CM | POA: Diagnosis not present

## 2016-05-12 DIAGNOSIS — S064X9A Epidural hemorrhage with loss of consciousness of unspecified duration, initial encounter: Secondary | ICD-10-CM | POA: Diagnosis not present

## 2016-05-12 LAB — CBC
HCT: 34.7 % — ABNORMAL LOW (ref 36.0–46.0)
Hemoglobin: 11.6 g/dL — ABNORMAL LOW (ref 12.0–15.0)
MCH: 30.1 pg (ref 26.0–34.0)
MCHC: 33.4 g/dL (ref 30.0–36.0)
MCV: 89.9 fL (ref 78.0–100.0)
PLATELETS: 128 10*3/uL — AB (ref 150–400)
RBC: 3.86 MIL/uL — ABNORMAL LOW (ref 3.87–5.11)
RDW: 16.1 % — AB (ref 11.5–15.5)
WBC: 7.7 10*3/uL (ref 4.0–10.5)

## 2016-05-12 LAB — BASIC METABOLIC PANEL
Anion gap: 7 (ref 5–15)
BUN: 16 mg/dL (ref 6–20)
CHLORIDE: 106 mmol/L (ref 101–111)
CO2: 25 mmol/L (ref 22–32)
CREATININE: 0.77 mg/dL (ref 0.44–1.00)
Calcium: 8.4 mg/dL — ABNORMAL LOW (ref 8.9–10.3)
GFR calc Af Amer: >60 mL/min (ref >60)
GFR calc non Af Amer: >60 mL/min (ref >60)
Glucose, Bld: 143 mg/dL — ABNORMAL HIGH (ref 65–99)
POTASSIUM: 3.8 mmol/L (ref 3.5–5.1)
SODIUM: 138 mmol/L (ref 135–145)

## 2016-05-12 LAB — ECHOCARDIOGRAM COMPLETE
Height: 65 in
WEIGHTICAEL: 1920 [oz_av]

## 2016-05-12 MED ORDER — METOPROLOL TARTRATE 25 MG PO TABS
12.5000 mg | ORAL_TABLET | Freq: Two times a day (BID) | ORAL | Status: DC
  Administered 2016-05-12 – 2016-05-14 (×5): 12.5 mg via ORAL
  Filled 2016-05-12 (×5): qty 1

## 2016-05-12 MED ORDER — POTASSIUM CHLORIDE CRYS ER 20 MEQ PO TBCR
20.0000 meq | EXTENDED_RELEASE_TABLET | Freq: Once | ORAL | Status: AC
  Administered 2016-05-12: 20 meq via ORAL
  Filled 2016-05-12: qty 1

## 2016-05-12 NOTE — Progress Notes (Signed)
  Echocardiogram 2D Echocardiogram has been performed.  Laurie Murphy 05/12/2016, 2:09 PM

## 2016-05-12 NOTE — Progress Notes (Signed)
PROGRESS NOTE  Laurie Murphy  ZOX:[id number] DOB: [birth year] DOA: 05/11/2016 PCP: Jacquiline Doe, MD   Brief Narrative: Laurie Murphy is a pleasant 83 y.o. female with a history of HTN who lives alone and tripped on a blanket at home, falling onto her head. She got up, called her daughter who recommended she call EMS. On arrival she was found to have a scalp laceration, an epidural hematoma and A-flutter with RVR. She had taken aspirin 325 mg the prior day. She did not have any head or neck pain, neurological changes, chest pain or palpitations. She was able to get up and walk after the fall. No recent illnesses. Remarkably healthy otherwise. CT head showed a left parietal epidural hematoma measuring 2.9 x 2.6 x 0.9 cm. She demonstrated SVT in 140s improved after 2.5 mg IV Lopressor to 70-80s with A-fib/ flutter. Neurosurgery was consulted, recommended nonoperative management. She was brought in for observation, repeat CT scan the following day showed no changes. HR began increasing again and metoprolol was started with good effect. An echocardiogram has been performed and is pending. Physical therapy evaluated the patient and recommended SNF disposition. Social work has been consulted.   Assessment & Plan: Principal Problem:   Epidural hematoma (HCC) Active Problems:   HYPERTENSION, BENIGN SYSTEMIC   Atrial flutter with rapid ventricular response (HCC)  Epidural hematoma: Due to mechanical fall at home. CT showed convex left parietal hematoma  2.9 x 2.6 x 0.9 cm stable on repeat imaging 4/5, presumed to be epidural. No evidence of skull fracture. Neurosurgery, Dr. Mikal Plane contacted by EDP recommending nonoperative management.   - Hold ASA - Will need staple removal between 4/13 - 4/17 - PT evaluation: recommended SNF, previously living at home alone. CSW consulted.  Spondylosis: CT cervical spine showed no fractures, extensive multilevel arthropathy, and spondylolisthesis at C4-5 felt to be due  to underlying spondylosis.  - Pain control  Essential HTN: Chronic, stable - Continuing norvasc, starting metoprolol as below  Atrial flutter with rapid ventricular response: Present on admission, improved with IV beta blocker, but remains.  - Will start low dose po metoprolol and monitor on telemetry overnight.   - CHA2DS2-VASc scoreis at least 4, but anticoagulation held due to fall, hematoma.  - Maintain K > 4 (give today), check this and Mg in AM. - TSH: 2.116; free T4 1.14.  - Echocardiogram read pending  DVT prophylaxis: SCDs Code Status: DNR Family Communication: Daughter, HCPOA, by phone Disposition Plan: Transfer to floor, anticipate DC to SNF vs. home with 24-hr supervision if Hr remains controlled.  Consultants:   Neurosurgery, Dr. Franky Macho by phone on admission  Procedures:   Echocardiogram 05/12/2016: Pending  Antimicrobials:  None   Subjective: Pt without complaints. Headache is manageable. Slept ok. No event overnight. Denies dizziness, vision changes.   Objective: Vitals:   05/12/16 0839 05/12/16 0900 05/12/16 1200 05/12/16 1230  BP: (!) 172/76   140/68  Pulse: (!) 110   69  Resp: (!) 23   19  Temp:  98.1 F (36.7 C) 97.9 F (36.6 C)   TempSrc:  Axillary Axillary   SpO2: 99%   99%  Weight:      Height:       General exam: Pleasant elderly female in no distress Respiratory system: Non-labored breathing room air. Clear to auscultation bilaterally.  Cardiovascular system: Irreg in 100-110's this AM. No murmur, rub, or gallop. No JVD, and no pedal edema. Gastrointestinal system: Abdomen soft, non-tender, non-distended, with normoactive  bowel sounds. No organomegaly or masses felt. Central nervous system: Alert and oriented. No focal neurological deficits. Extremities: Warm, no deformities Skin: Posterior scalp with longitudinal laceration with staples in place without evidence of dehiscence, erythema, drainage.  Psychiatry: Judgement and insight  appear normal. Mood & affect appropriate.   Data Reviewed: I have personally reviewed following labs and imaging studies  CBC:  Recent Labs Lab 05/11/16 1213 05/12/16 0311  WBC 6.8 7.7  NEUTROABS 5.1  --   HGB 11.8* 11.6*  HCT 35.7* 34.7*  MCV 88.1 89.9  PLT 150 128*   Basic Metabolic Panel:  Recent Labs Lab 05/11/16 1321 05/12/16 0311  NA 141 138  K 4.0 3.8  CL 107 106  CO2 28 25  GLUCOSE 105* 143*  BUN 25* 16  CREATININE 1.06* 0.77  CALCIUM 8.5* 8.4*   GFR: Estimated Creatinine Clearance: 30.5 mL/min (by C-G formula based on SCr of 0.77 mg/dL). Liver Function Tests: No results for input(s): AST, ALT, ALKPHOS, BILITOT, PROT, ALBUMIN in the last 168 hours. No results for input(s): LIPASE, AMYLASE in the last 168 hours. No results for input(s): AMMONIA in the last 168 hours. Coagulation Profile:  Recent Labs Lab 05/11/16 1213  INR 1.01   Cardiac Enzymes: No results for input(s): CKTOTAL, CKMB, CKMBINDEX, TROPONINI in the last 168 hours. BNP (last 3 results) No results for input(s): PROBNP in the last 8760 hours. HbA1C: No results for input(s): HGBA1C in the last 72 hours. CBG: No results for input(s): GLUCAP in the last 168 hours. Lipid Profile: No results for input(s): CHOL, HDL, LDLCALC, TRIG, CHOLHDL, LDLDIRECT in the last 72 hours. Thyroid Function Tests:  Recent Labs  05/11/16 1755  TSH 2.116  FREET4 1.14*   Anemia Panel: No results for input(s): VITAMINB12, FOLATE, FERRITIN, TIBC, IRON, RETICCTPCT in the last 72 hours. Urine analysis:    Component Value Date/Time   COLORURINE YELLOW 12/12/2010 1355   APPEARANCEUR CLEAR 12/12/2010 1355   LABSPEC 1.005 12/12/2010 1355   PHURINE 7.5 12/12/2010 1355   GLUCOSEU NEGATIVE 12/12/2010 1355   HGBUR NEGATIVE 12/12/2010 1355   BILIRUBINUR NEG 04/01/2013 1641   KETONESUR NEGATIVE 12/12/2010 1355   PROTEINUR NEG 04/01/2013 1641   PROTEINUR NEGATIVE 12/12/2010 1355   UROBILINOGEN 0.2 04/01/2013 1641    UROBILINOGEN 0.2 12/12/2010 1355   NITRITE NEG 04/01/2013 1641   NITRITE NEGATIVE 12/12/2010 1355   LEUKOCYTESUR small (1+) 04/01/2013 1641   Recent Results (from the past 240 hour(s))  MRSA PCR Screening     Status: None   Collection Time: 05/11/16  9:29 PM  Result Value Ref Range Status   MRSA by PCR NEGATIVE NEGATIVE Final    Comment:        The GeneXpert MRSA Assay (FDA approved for NASAL specimens only), is one component of a comprehensive MRSA colonization surveillance program. It is not intended to diagnose MRSA infection nor to guide or monitor treatment for MRSA infections.       Radiology Studies: Ct Head Wo Contrast  Result Date: 05/12/2016 CLINICAL DATA:  Epidural hematoma. EXAM: CT HEAD WITHOUT CONTRAST TECHNIQUE: Contiguous axial images were obtained from the base of the skull through the vertex without intravenous contrast. COMPARISON:  Yesterday FINDINGS: Brain: Stable extra-axial hemorrhage along the left parietal convexity which measures 26 mm in length by a 9 mm in thickness. The hematoma does have a convex shape, but there is no overlying fracture or suture crossing to definitively differentiate epidural from subdural location. Tiny focus of left perirolandic  subarachnoid hemorrhage is stable in retrospect. No infarct.  Age related volume loss and Lingard matter low-density. Vascular: Atherosclerotic calcification. Skull: Negative for fracture.  Scalp staples near the vertex. Sinuses/Orbits: No posttraumatic finding. Bilateral cataract resection. IMPRESSION: Unchanged left frontal parietal extra-axial hemorrhage, including a small focus of subarachnoid hemorrhage. No significant mass effect. No new abnormality. Electronically Signed   By: Marnee Spring M.D.   On: 05/12/2016 08:50   Ct Head Wo Contrast  Result Date: 05/11/2016 CLINICAL DATA:  Pain following fall EXAM: CT HEAD WITHOUT CONTRAST CT CERVICAL SPINE WITHOUT CONTRAST TECHNIQUE: Multidetector CT imaging of  the head and cervical spine was performed following the standard protocol without intravenous contrast. Multiplanar CT image reconstructions of the cervical spine were also generated. COMPARISON:  Head CT October 01, 2010 FINDINGS: CT HEAD FINDINGS Brain: There is stable mild atrophy. Asymmetric atrophy in the left cerebellar region compared to the right is stable. There is an epidural hematoma in the left parietal region measuring 2.9 cm from superior to inferior, 2.6 cm from anterior to posterior, and 0.9 cm from right to left. No other extra-axial fluid collections are evident. There is no intra-axial mass or intra-axial hemorrhage. There is no midline shift. There is patchy small vessel disease in the centra semiovale bilaterally. Elsewhere gray-Swisher compartments appear unremarkable. No evident acute infarct. Vascular: There is no hyperdense vessel. There is extensive calcification in both carotid siphon regions. Skull: Bony calvarium appears intact. Sinuses/Orbits: There is mucosal thickening in several ethmoid air cells bilaterally. There is mild mucosal thickening in the posterior right maxillary region. Other visualized paranasal sinuses are clear. Orbits appear symmetric bilaterally. Other: Mastoid air cells are clear. CT CERVICAL SPINE FINDINGS Alignment: There is mild levoscoliosis. There is 3 mm of anterolisthesis of C4 on C5. No other appreciable spondylolisthesis. Skull base and vertebrae: Skull base and craniocervical junction regions appear unremarkable. No fracture is evident. There are no blastic or lytic bone lesions. Murphy are osteoporotic. Soft tissues and spinal canal: Prevertebral soft tissues and predental space regions are normal. There is no paraspinous lesion. There is no evident cord or canal hematoma. Disc levels: There is marked disc space narrowing at C5-6 and C6-7. There is moderately severe disc space narrowing at C4-5, C7-T1, and the upper thoracic regions. There is multilevel facet  hypertrophy. There is exit foraminal narrowing due to facet hypertrophy at C3-4 on the right, at C4-5 bilaterally, at C5-6 on the right, and at C6-7 on the right. There is no frank disc extrusion. Upper chest: There is aortic atherosclerosis. There is fibrosis in the visualized upper lung zones. Other: There is carotid artery calcification bilaterally. There is also subclavian artery calcification bilaterally. IMPRESSION: CT head: Left parietal epidural hematoma measuring 2.9 x 2.6 x 0.9 cm. No other extra-axial hemorrhage or fluid collection. No intra-axial hemorrhage. There is atrophy, stable, with stable periventricular small vessel disease. There are foci of arterial vascular calcification. There are foci of mild paranasal sinus disease. CT cervical spine: No demonstrable fracture. Spondylolisthesis at C4-5 is felt to be due to underlying spondylosis. There is extensive multilevel arthropathy. There is aortic atherosclerosis as well as calcification in both carotid and subclavian arteries. There is fibrotic change in the upper lung zones. Critical Value/emergent results were called by telephone at the time of interpretation on 05/11/2016 at 11:43 am to Dr. Crista Curb , who verbally acknowledged these results. Electronically Signed   By: Bretta Bang III M.D.   On: 05/11/2016 11:43   Ct Cervical  Spine Wo Contrast  Result Date: 05/11/2016 CLINICAL DATA:  Pain following fall EXAM: CT HEAD WITHOUT CONTRAST CT CERVICAL SPINE WITHOUT CONTRAST TECHNIQUE: Multidetector CT imaging of the head and cervical spine was performed following the standard protocol without intravenous contrast. Multiplanar CT image reconstructions of the cervical spine were also generated. COMPARISON:  Head CT October 01, 2010 FINDINGS: CT HEAD FINDINGS Brain: There is stable mild atrophy. Asymmetric atrophy in the left cerebellar region compared to the right is stable. There is an epidural hematoma in the left parietal region measuring 2.9 cm  from superior to inferior, 2.6 cm from anterior to posterior, and 0.9 cm from right to left. No other extra-axial fluid collections are evident. There is no intra-axial mass or intra-axial hemorrhage. There is no midline shift. There is patchy small vessel disease in the centra semiovale bilaterally. Elsewhere gray-Sedlar compartments appear unremarkable. No evident acute infarct. Vascular: There is no hyperdense vessel. There is extensive calcification in both carotid siphon regions. Skull: Bony calvarium appears intact. Sinuses/Orbits: There is mucosal thickening in several ethmoid air cells bilaterally. There is mild mucosal thickening in the posterior right maxillary region. Other visualized paranasal sinuses are clear. Orbits appear symmetric bilaterally. Other: Mastoid air cells are clear. CT CERVICAL SPINE FINDINGS Alignment: There is mild levoscoliosis. There is 3 mm of anterolisthesis of C4 on C5. No other appreciable spondylolisthesis. Skull base and vertebrae: Skull base and craniocervical junction regions appear unremarkable. No fracture is evident. There are no blastic or lytic bone lesions. Murphy are osteoporotic. Soft tissues and spinal canal: Prevertebral soft tissues and predental space regions are normal. There is no paraspinous lesion. There is no evident cord or canal hematoma. Disc levels: There is marked disc space narrowing at C5-6 and C6-7. There is moderately severe disc space narrowing at C4-5, C7-T1, and the upper thoracic regions. There is multilevel facet hypertrophy. There is exit foraminal narrowing due to facet hypertrophy at C3-4 on the right, at C4-5 bilaterally, at C5-6 on the right, and at C6-7 on the right. There is no frank disc extrusion. Upper chest: There is aortic atherosclerosis. There is fibrosis in the visualized upper lung zones. Other: There is carotid artery calcification bilaterally. There is also subclavian artery calcification bilaterally. IMPRESSION: CT head: Left  parietal epidural hematoma measuring 2.9 x 2.6 x 0.9 cm. No other extra-axial hemorrhage or fluid collection. No intra-axial hemorrhage. There is atrophy, stable, with stable periventricular small vessel disease. There are foci of arterial vascular calcification. There are foci of mild paranasal sinus disease. CT cervical spine: No demonstrable fracture. Spondylolisthesis at C4-5 is felt to be due to underlying spondylosis. There is extensive multilevel arthropathy. There is aortic atherosclerosis as well as calcification in both carotid and subclavian arteries. There is fibrotic change in the upper lung zones. Critical Value/emergent results were called by telephone at the time of interpretation on 05/11/2016 at 11:43 am to Dr. Crista Curb , who verbally acknowledged these results. Electronically Signed   By: Bretta Bang III M.D.   On: 05/11/2016 11:43    Scheduled Meds: . amLODipine  10 mg Oral Daily  . metoprolol tartrate  12.5 mg Oral BID  . sodium chloride flush  3 mL Intravenous Q12H  . Travoprost (BAK Free)  1 drop Left Eye QHS   Continuous Infusions:   LOS: 1 day   Time spent: 25 minutes.  Hazeline Junker, MD [facility] Pager [phone]  If 7PM-7AM, please contact night-coverage [email or web address] Password [email or web address] 05/12/2016, 2:25 PM

## 2016-05-12 NOTE — Evaluation (Signed)
Physical Therapy Evaluation Patient Details Name: Laurie Murphy MRN: [id number] DOB: Nov 01, 1912 Today's Date: 05/12/2016   History of Present Illness  83 y.o. female with medical history significant of HTN who lives alone and fell when she tripped on a towel she was carrying and hit her head.  Pt admitted with scalp laceration and Left parietal epidural hematoma   Clinical Impression  Pt admitted with above diagnosis. Pt currently with functional limitations due to the deficits listed below (see PT Problem List).  Pt will benefit from skilled PT to increase their independence and safety with mobility to allow discharge to the venue listed below.  Pt with posterior LOB x3 during ambulation so recommend 24/7 supervision/ assist for safety upon d/c.  If this is not available at home (pt lives alone) then may need ST - SNF.     Follow Up Recommendations Supervision/Assistance - 24 hour;SNF (if 24/7 assist available then HHPT, if not then SNF)    Equipment Recommendations       Recommendations for Other Services       Precautions / Restrictions Precautions Precautions: Fall Precaution Comments: monitor HR      Mobility  Bed Mobility               General bed mobility comments: pt up in recliner on arrival  Transfers Overall transfer level: Needs assistance Equipment used: None Transfers: Sit to/from Stand Sit to Stand: Min guard         General transfer comment: slow to rise and able to self steady, min/guard for safety  Ambulation/Gait Ambulation/Gait assistance: Min assist Ambulation Distance (Feet): 120 Feet Assistive device: None Gait Pattern/deviations: Step-through pattern;Wide base of support;Drifts right/left     General Gait Details: pt presents with unsteady gait, typically doesn't use assistive device, 3 instances of posterior LOB with assist required to correct, HR up to 130 bpm during ambulation  Stairs            Wheelchair Mobility     Modified Rankin (Stroke Patients Only)       Balance Overall balance assessment: Needs assistance;History of Falls         Standing balance support: No upper extremity supported Standing balance-Leahy Scale: Fair Standing balance comment: able stand statically without assist or support however does not tolerate challenges                             Pertinent Vitals/Pain Pain Assessment: No/denies pain    Home Living Family/patient expects to be discharged to:: Private residence Living Arrangements: Alone   Type of Home: House       Home Layout: Able to live on main level with bedroom/bathroom;Laundry or work area in [employer]: None      Prior Function Level of Independence: Independent               Higher education careers adviser        Extremity/Trunk Assessment        Lower Extremity Assessment Lower Extremity Assessment: Generalized weakness    Cervical / Trunk Assessment Cervical / Trunk Assessment: Kyphotic  Communication   Communication: HOH  Cognition Arousal/Alertness: Awake/alert Behavior During Therapy: WFL for tasks assessed/performed Overall Cognitive Status: Within Functional Limits for tasks assessed  General Comments      Exercises     Assessment/Plan    PT Assessment Patient needs continued PT services  PT Problem List Decreased strength;Decreased mobility;Decreased balance;Decreased activity tolerance;Decreased knowledge of use of DME       PT Treatment Interventions DME instruction;Gait training;Therapeutic exercise;Therapeutic activities;Functional mobility training;Patient/family education;Balance training    PT Goals (Current goals can be found in the Care Plan section)  Acute Rehab PT Goals PT Goal Formulation: With patient Time For Goal Achievement: 05/26/16 Potential to Achieve Goals: Good    Frequency Min 3X/week   Barriers to discharge         Co-evaluation               End of Session Equipment Utilized During Treatment: Gait belt Activity Tolerance: Patient tolerated treatment well Patient left: with call bell/phone within reach;in chair;with chair alarm set Nurse Communication: Mobility status PT Visit Diagnosis: Unsteadiness on feet (R26.81);History of falling (Z91.81)    Time: 6644-0347 PT Time Calculation (min) (ACUTE ONLY): 18 min   Charges:   PT Evaluation $PT Eval Moderate Complexity: 1 Procedure     PT G Codes:   PT G-Codes **NOT FOR INPATIENT CLASS** Functional Assessment Tool Used: AM-PAC 6 Clicks Basic Mobility;Clinical judgement Functional Limitation: Mobility: Walking and moving around Mobility: Walking and Moving Around Current Status (Q2595): At least 40 percent but less than 60 percent impaired, limited or restricted Mobility: Walking and Moving Around Goal Status [phone]): At least 20 percent but less than 40 percent impaired, limited or restricted    Zenovia Jarred, PT, DPT 05/12/2016 Pager: [phone]   Maida Sale E 05/12/2016, 1:33 PM

## 2016-05-13 ENCOUNTER — Encounter (HOSPITAL_COMMUNITY): Payer: Self-pay | Admitting: Cardiology

## 2016-05-13 DIAGNOSIS — I10 Essential (primary) hypertension: Secondary | ICD-10-CM | POA: Diagnosis not present

## 2016-05-13 DIAGNOSIS — I350 Nonrheumatic aortic (valve) stenosis: Secondary | ICD-10-CM | POA: Diagnosis not present

## 2016-05-13 DIAGNOSIS — S064X9A Epidural hemorrhage with loss of consciousness of unspecified duration, initial encounter: Secondary | ICD-10-CM

## 2016-05-13 DIAGNOSIS — I481 Persistent atrial fibrillation: Secondary | ICD-10-CM

## 2016-05-13 DIAGNOSIS — S064X0A Epidural hemorrhage without loss of consciousness, initial encounter: Secondary | ICD-10-CM | POA: Diagnosis not present

## 2016-05-13 DIAGNOSIS — I483 Typical atrial flutter: Secondary | ICD-10-CM

## 2016-05-13 LAB — BASIC METABOLIC PANEL
ANION GAP: 4 — AB (ref 5–15)
BUN: 20 mg/dL (ref 6–20)
CHLORIDE: 109 mmol/L (ref 101–111)
CO2: 26 mmol/L (ref 22–32)
Calcium: 8.5 mg/dL — ABNORMAL LOW (ref 8.9–10.3)
Creatinine, Ser: 0.93 mg/dL (ref 0.44–1.00)
GFR calc Af Amer: 56 mL/min — ABNORMAL LOW (ref >60)
GFR calc non Af Amer: 48 mL/min — ABNORMAL LOW (ref >60)
GLUCOSE: 101 mg/dL — AB (ref 65–99)
POTASSIUM: 4.3 mmol/L (ref 3.5–5.1)
Sodium: 139 mmol/L (ref 135–145)

## 2016-05-13 LAB — CBC
HCT: 31.6 % — ABNORMAL LOW (ref 36.0–46.0)
HEMOGLOBIN: 10.5 g/dL — AB (ref 12.0–15.0)
MCH: 29.7 pg (ref 26.0–34.0)
MCHC: 33.2 g/dL (ref 30.0–36.0)
MCV: 89.3 fL (ref 78.0–100.0)
Platelets: 133 10*3/uL — ABNORMAL LOW (ref 150–400)
RBC: 3.54 MIL/uL — AB (ref 3.87–5.11)
RDW: 16.1 % — ABNORMAL HIGH (ref 11.5–15.5)
WBC: 7.1 10*3/uL (ref 4.0–10.5)

## 2016-05-13 LAB — MAGNESIUM: Magnesium: 1.9 mg/dL (ref 1.7–2.4)

## 2016-05-13 NOTE — Progress Notes (Signed)
PROGRESS NOTE  Laurie Murphy  WJX:[id number] DOB: 07-09-1912 DOA: 05/11/2016 PCP: Jacquiline Doe, MD   Brief Narrative: Laurie Murphy is a pleasant 83 y.o. female with history of HTN who accidentally tripped on a blanket at home sustaining a mechanical fall with resultant head injury - epidural hematoma with scalp laceration. She was also noted to be in A. fib/flutter with rapid ventricular response, which is apparently new. Neurosurgery recommended conservative observational management.   Cardiology consult also recommended continued rate control without any coagulation for now due to her epidural hematoma, with plans for outpatient cardiology follow-up.  Physical therapy evaluated the patient and recommended SNF disposition. Social work has been consulted.    Assessment & Plan: Principal Problem:   Epidural hematoma (HCC) Active Problems:   HYPERTENSION, BENIGN SYSTEMIC   Typical atrial flutter (HCC)  #1.Epidural hematoma:   Due to mechanical fall at home.  CT - convex left parietal hematoma  2.9 x 2.6 x 0.9 cm stable on repeat imaging 4/5, presumed to be epidural. Neurosurgery, Dr. Mikal Plane recommends nonoperative management.   Hold ASA/anticoagulation for now - neurology follow-up on discharge Will need staple removal between 4/13 - 4/17 PT evaluation: recommended SNF, previously living at home alone  #2 Atrial fib/flutter:  CHA2DS2/VAS 4 High risk for embolic CVA -no anticoagulation; consider Eliquis in 6-8 weeks Present on admission Return control with metoprolol TSH: 2.116; free T4 1.14.  Echocardiogram( 05/12/2016) -EF 55-60%, moderate to severe AS, moderate MR/TR, moderate to severe LA Dilatation, moderate PASP of 65 mmHg  #3 Valvular Heart Disease-AS, MR and TR: Echo(05/12/2016) -EF 55-60%, moderate to severe AS, moderate MR/TR, moderate to severe LA Dilatation, moderate PASP of 65 mmHg. Deemed not a surgical candidate at this time    #4 Spondylosis:  CT cervical  spine - no fractures, extensive multilevel arthropathy, and spondylolisthesis at C4-5   Pain control     DVT prophylaxis: SCDs Code Status: DNR Family Communication:  Disposition Plan: SNF vs. home with 24-hr supervision   Consultants: Neurosurgery, Dr. Franky Macho by phone on admission Cardiology -Dr. Bo Merino  Procedures: Echocardiogram 05/12/2016:  Antimicrobials:  None   Subjective: Patient resting comfortably and limits and appropriate. No acute events reported overnight. Denies any chest pain shortness of breath.   Objective: Vitals:   05/13/16 0900 05/13/16 1000 05/13/16 1100 05/13/16 1200  BP:    (!) 173/80  Pulse: 94 (!) 109 94 86  Resp: (!) 21 (!) 24 15 (!) 32  Temp:      TempSrc:      SpO2: 96% 98% (!) 89% 96%  Weight:      Height:       General exam: Pleasant elderly female in no distress Respiratory system: Non-labored breathing room air. Clear to auscultation bilaterally.  Cardiovascular system: IRRR, (+) systolic murmur, rub, or gallop. No JVD, and no pedal edema. Gastrointestinal system: Abdomen soft, non-tender, non-distended, with normoactive bowel sounds. No organomegaly or masses felt. Central nervous system: Alert and oriented. No focal neurological deficits. Extremities: Warm, no deformities Skin: Posterior scalp with longitudinal laceration with staples in place without evidence of dehiscence, erythema, drainage.  Psychiatry: Judgement and insight appear normal. Mood & affect appropriate.   Data Reviewed: I have personally reviewed following labs and imaging studies  CBC:  Recent Labs Lab 05/11/16 1213 05/12/16 0311 05/13/16 0313  WBC 6.8 7.7 7.1  NEUTROABS 5.1  --   --   HGB 11.8* 11.6* 10.5*  HCT 35.7* 34.7* 31.6*  MCV 88.1 89.9  89.3  PLT 150 128* 133*   Basic Metabolic Panel:  Recent Labs Lab 05/11/16 1321 05/12/16 0311 05/13/16 0313  NA 141 138 139  K 4.0 3.8 4.3  CL 107 106 109  CO2 GLUCOSE 105* 143* 101*    BUN 25* 16 20  CREATININE 1.06* 0.77 0.93  CALCIUM 8.5* 8.4* 8.5*  MG  --   --  1.9   GFR: Estimated Creatinine Clearance: 26.2 mL/min (by C-G formula based on SCr of 0.93 mg/dL). Liver Function Tests: No results for input(s): AST, ALT, ALKPHOS, BILITOT, PROT, ALBUMIN in the last 168 hours. No results for input(s): LIPASE, AMYLASE in the last 168 hours. No results for input(s): AMMONIA in the last 168 hours. Coagulation Profile:  Recent Labs Lab 05/11/16 1213  INR 1.01   Cardiac Enzymes: No results for input(s): CKTOTAL, CKMB, CKMBINDEX, TROPONINI in the last 168 hours. BNP (last 3 results) No results for input(s): PROBNP in the last 8760 hours. HbA1C: No results for input(s): HGBA1C in the last 72 hours. CBG: No results for input(s): GLUCAP in the last 168 hours. Lipid Profile: No results for input(s): CHOL, HDL, LDLCALC, TRIG, CHOLHDL, LDLDIRECT in the last 72 hours. Thyroid Function Tests:  Recent Labs  05/11/16 1755  TSH 2.116  FREET4 1.14*   Anemia Panel: No results for input(s): VITAMINB12, FOLATE, FERRITIN, TIBC, IRON, RETICCTPCT in the last 72 hours. Urine analysis:    Component Value Date/Time   COLORURINE YELLOW 12/12/2010 1355   APPEARANCEUR CLEAR 12/12/2010 1355   LABSPEC 1.005 12/12/2010 1355   PHURINE 7.5 12/12/2010 1355   GLUCOSEU NEGATIVE 12/12/2010 1355   HGBUR NEGATIVE 12/12/2010 1355   BILIRUBINUR NEG 04/01/2013 1641   KETONESUR NEGATIVE 12/12/2010 1355   PROTEINUR NEG 04/01/2013 1641   PROTEINUR NEGATIVE 12/12/2010 1355   UROBILINOGEN 0.2 04/01/2013 1641   UROBILINOGEN 0.2 12/12/2010 1355   NITRITE NEG 04/01/2013 1641   NITRITE NEGATIVE 12/12/2010 1355   LEUKOCYTESUR small (1+) 04/01/2013 1641   Recent Results (from the past 240 hour(s))  MRSA PCR Screening     Status: None   Collection Time: 05/11/16  9:29 PM  Result Value Ref Range Status   MRSA by PCR NEGATIVE NEGATIVE Final    Comment:        The GeneXpert MRSA Assay  (FDA approved for NASAL specimens only), is one component of a comprehensive MRSA colonization surveillance program. It is not intended to diagnose MRSA infection nor to guide or monitor treatment for MRSA infections.       Radiology Studies: Ct Head Wo Contrast  Result Date: 05/12/2016 CLINICAL DATA:  Epidural hematoma. EXAM: CT HEAD WITHOUT CONTRAST TECHNIQUE: Contiguous axial images were obtained from the base of the skull through the vertex without intravenous contrast. COMPARISON:  Yesterday FINDINGS: Brain: Stable extra-axial hemorrhage along the left parietal convexity which measures 26 mm in length by a 9 mm in thickness. The hematoma does have a convex shape, but there is no overlying fracture or suture crossing to definitively differentiate epidural from subdural location. Tiny focus of left perirolandic subarachnoid hemorrhage is stable in retrospect. No infarct.  Age related volume loss and Rehfeld matter low-density. Vascular: Atherosclerotic calcification. Skull: Negative for fracture.  Scalp staples near the vertex. Sinuses/Orbits: No posttraumatic finding. Bilateral cataract resection. IMPRESSION: Unchanged left frontal parietal extra-axial hemorrhage, including a small focus of subarachnoid hemorrhage. No significant mass effect. No new abnormality. Electronically Signed   By: Marnee Spring M.D.   On: 05/12/2016  08:50    Scheduled Meds: . amLODipine  10 mg Oral Daily  . metoprolol tartrate  12.5 mg Oral BID  . sodium chloride flush  3 mL Intravenous Q12H  . Travoprost (BAK Free)  1 drop Left Eye QHS   Continuous Infusions:   LOS: 1 day   Time spent: 25 minutes.  Jackie Plum, MD [facility] Pager [phone]  If 7PM-7AM, please contact night-coverage [email or web address] Password [email or web address] 05/13/2016, 1:02 PM

## 2016-05-13 NOTE — Consult Note (Signed)
Cardiology Consult    Patient ID: Laurie Murphy MRN: [id number], DOB/AGE: 1912-11-13   Admit date: 05/11/2016 Date of Consult: 05/13/2016  Primary Physician: Jacquiline Doe, MD Reason for Consult: atrial flutter/fib Primary Cardiologist: New Requesting Provider: Dr. Julio Sicks  History of Present Illness    Laurie Murphy is a 83 y.o. female who is being seen today for the evaluation of new onset atrial flutter at the request of Dr. Julio Sicks. The patient has a past medical history of HTN, She was found to be in atrial flutter after a mechanical fall with parietal hematoma.  The patient tripped over her shawl and fell. She denies having has any chest pain, shortness of breath, palpitations involved with the fall or any prior to that. She has no cardiac history.  CT Head: left frontal parietal extra-axial hemorrhage, including a small focus of subarachnoid hemorrhage TSH 05/11/16 2.116 SCr 0.93, K+ 4.3 Hgb 11.6,  Plt 133  Past Medical History   Past Medical History:  Diagnosis Date  . Hypertension   . Shingles     History reviewed. No pertinent surgical history.   Allergies  No Known Allergies  Inpatient Medications    . amLODipine  10 mg Oral Daily  . metoprolol tartrate  12.5 mg Oral BID  . sodium chloride flush  3 mL Intravenous Q12H  . Travoprost (BAK Free)  1 drop Left Eye QHS    Family History    Family History  Problem Relation Age of Onset  . Stroke Mother      Social History    Social History   Social History  . Marital status: Widowed    Spouse name: N/A  . Number of children: N/A  . Years of education: N/A   Occupational History  . Not on file.   Social History Main Topics  . Smoking status: Never Smoker  . Smokeless tobacco: Never Used  . Alcohol use No  . Drug use: No  . Sexual activity: Not Currently   Other Topics Concern  . Not on file   Social History Narrative  . No narrative on file     Review of Systems    General:  No  chills, fever, night sweats or weight changes.  Cardiovascular:  No chest pain, dyspnea on exertion, edema, orthopnea, palpitations, paroxysmal nocturnal dyspnea. Dermatological: No rash, lesions/masses Respiratory: No cough, dyspnea Urologic: No hematuria, dysuria Abdominal:   No nausea, vomiting, diarrhea, bright red blood per rectum, melena, or hematemesis Neurologic:  No visual changes, wkns, changes in mental status. All other systems reviewed and are otherwise negative except as noted above.  Physical Exam    Blood pressure (!) 160/66, pulse 94, temperature 98.1 F (36.7 C), temperature source Oral, resp. rate (!) 34, height  (1.651 m), weight 120 lb (54.4 kg), SpO2 95 %.  General: Pleasant, very sharp 83 year old female, NAD Psych: Normal affect. Neuro: Alert and oriented X 3. Moves all extremities spontaneously. HEENT: Normal  Neck: Supple without bruits or JVD. Lungs:  Resp regular and unlabored, CTA. Heart: Irregularly irregular rhythm, or murmurs. Abdomen: Soft, non-tender, non-distended, BS + x 4.  Extremities: No clubbing, cyanosis or edema. DP/PT/Radials 2+ and equal bilaterally.  Labs    Troponin (Point of Care Test) No results for input(s): TROPIPOC in the last 72 hours. No results for input(s): CKTOTAL, CKMB, TROPONINI in the last 72 hours. Lab Results  Component Value Date   WBC 7.1 05/13/2016   HGB 10.5 (L)  05/13/2016   HCT 31.6 (L) 05/13/2016   MCV 89.3 05/13/2016   PLT 133 (L) 05/13/2016     Recent Labs Lab 05/13/16 0313  NA 139  K 4.3  CL 109  CO2 26  BUN 20  CREATININE 0.93  CALCIUM 8.5*  GLUCOSE 101*   Lab Results  Component Value Date   CHOL  10/29/2009    196        ATP III CLASSIFICATION:  <200     mg/dL   Desirable  811-914  mg/dL   Borderline High  >=782    mg/dL   High          HDL 64 10/29/2009   LDLCALC (H) 10/29/2009    122        Total Cholesterol/HDL:CHD Risk Coronary Heart Disease Risk Table                      Men   Women  1/2 Average Risk   3.4   3.3  Average Risk       5.0   4.4  2 X Average Risk   9.6   7.1  3 X Average Risk  23.4   11.0        Use the calculated Patient Ratio above and the CHD Risk Table to determine the patient's CHD Risk.        ATP III CLASSIFICATION (LDL):  <100     mg/dL   Optimal  956-213  mg/dL   Near or Above                    Optimal  130-159  mg/dL   Borderline  086-578  mg/dL   High  >469     mg/dL   Very High   TRIG 51 62/95/2841   No results found for: [facility]   Radiology Studies    Ct Head Wo Contrast  Result Date: 05/12/2016 CLINICAL DATA:  Epidural hematoma. EXAM: CT HEAD WITHOUT CONTRAST TECHNIQUE: Contiguous axial images were obtained from the base of the skull through the vertex without intravenous contrast. COMPARISON:  Yesterday FINDINGS: Brain: Stable extra-axial hemorrhage along the left parietal convexity which measures 26 mm in length by a 9 mm in thickness. The hematoma does have a convex shape, but there is no overlying fracture or suture crossing to definitively differentiate epidural from subdural location. Tiny focus of left perirolandic subarachnoid hemorrhage is stable in retrospect. No infarct.  Age related volume loss and Maull matter low-density. Vascular: Atherosclerotic calcification. Skull: Negative for fracture.  Scalp staples near the vertex. Sinuses/Orbits: No posttraumatic finding. Bilateral cataract resection. IMPRESSION: Unchanged left frontal parietal extra-axial hemorrhage, including a small focus of subarachnoid hemorrhage. No significant mass effect. No new abnormality. Electronically Signed   By: Marnee Spring M.D.   On: 05/12/2016 08:50   Ct Head Wo Contrast  Result Date: 05/11/2016 CLINICAL DATA:  Pain following fall EXAM: CT HEAD WITHOUT CONTRAST CT CERVICAL SPINE WITHOUT CONTRAST TECHNIQUE: Multidetector CT imaging of the head and cervical spine was performed following the standard protocol without intravenous contrast.  Multiplanar CT image reconstructions of the cervical spine were also generated. COMPARISON:  Head CT October 01, 2010 FINDINGS: CT HEAD FINDINGS Brain: There is stable mild atrophy. Asymmetric atrophy in the left cerebellar region compared to the right is stable. There is an epidural hematoma in the left parietal region measuring 2.9 cm from superior to inferior, 2.6 cm from anterior to posterior, and  0.9 cm from right to left. No other extra-axial fluid collections are evident. There is no intra-axial mass or intra-axial hemorrhage. There is no midline shift. There is patchy small vessel disease in the centra semiovale bilaterally. Elsewhere gray-Sumler compartments appear unremarkable. No evident acute infarct. Vascular: There is no hyperdense vessel. There is extensive calcification in both carotid siphon regions. Skull: Bony calvarium appears intact. Sinuses/Orbits: There is mucosal thickening in several ethmoid air cells bilaterally. There is mild mucosal thickening in the posterior right maxillary region. Other visualized paranasal sinuses are clear. Orbits appear symmetric bilaterally. Other: Mastoid air cells are clear. CT CERVICAL SPINE FINDINGS Alignment: There is mild levoscoliosis. There is 3 mm of anterolisthesis of C4 on C5. No other appreciable spondylolisthesis. Skull base and vertebrae: Skull base and craniocervical junction regions appear unremarkable. No fracture is evident. There are no blastic or lytic bone lesions. Bones are osteoporotic. Soft tissues and spinal canal: Prevertebral soft tissues and predental space regions are normal. There is no paraspinous lesion. There is no evident cord or canal hematoma. Disc levels: There is marked disc space narrowing at C5-6 and C6-7. There is moderately severe disc space narrowing at C4-5, C7-T1, and the upper thoracic regions. There is multilevel facet hypertrophy. There is exit foraminal narrowing due to facet hypertrophy at C3-4 on the right, at C4-5  bilaterally, at C5-6 on the right, and at C6-7 on the right. There is no frank disc extrusion. Upper chest: There is aortic atherosclerosis. There is fibrosis in the visualized upper lung zones. Other: There is carotid artery calcification bilaterally. There is also subclavian artery calcification bilaterally. IMPRESSION: CT head: Left parietal epidural hematoma measuring 2.9 x 2.6 x 0.9 cm. No other extra-axial hemorrhage or fluid collection. No intra-axial hemorrhage. There is atrophy, stable, with stable periventricular small vessel disease. There are foci of arterial vascular calcification. There are foci of mild paranasal sinus disease. CT cervical spine: No demonstrable fracture. Spondylolisthesis at C4-5 is felt to be due to underlying spondylosis. There is extensive multilevel arthropathy. There is aortic atherosclerosis as well as calcification in both carotid and subclavian arteries. There is fibrotic change in the upper lung zones. Critical Value/emergent results were called by telephone at the time of interpretation on 05/11/2016 at 11:43 am to Dr. Crista Curb , who verbally acknowledged these results. Electronically Signed   By: Bretta Bang III M.D.   On: 05/11/2016 11:43   Ct Cervical Spine Wo Contrast  Result Date: 05/11/2016 CLINICAL DATA:  Pain following fall EXAM: CT HEAD WITHOUT CONTRAST CT CERVICAL SPINE WITHOUT CONTRAST TECHNIQUE: Multidetector CT imaging of the head and cervical spine was performed following the standard protocol without intravenous contrast. Multiplanar CT image reconstructions of the cervical spine were also generated. COMPARISON:  Head CT October 01, 2010 FINDINGS: CT HEAD FINDINGS Brain: There is stable mild atrophy. Asymmetric atrophy in the left cerebellar region compared to the right is stable. There is an epidural hematoma in the left parietal region measuring 2.9 cm from superior to inferior, 2.6 cm from anterior to posterior, and 0.9 cm from right to left. No other  extra-axial fluid collections are evident. There is no intra-axial mass or intra-axial hemorrhage. There is no midline shift. There is patchy small vessel disease in the centra semiovale bilaterally. Elsewhere gray-Soden compartments appear unremarkable. No evident acute infarct. Vascular: There is no hyperdense vessel. There is extensive calcification in both carotid siphon regions. Skull: Bony calvarium appears intact. Sinuses/Orbits: There is mucosal thickening in several ethmoid air  cells bilaterally. There is mild mucosal thickening in the posterior right maxillary region. Other visualized paranasal sinuses are clear. Orbits appear symmetric bilaterally. Other: Mastoid air cells are clear. CT CERVICAL SPINE FINDINGS Alignment: There is mild levoscoliosis. There is 3 mm of anterolisthesis of C4 on C5. No other appreciable spondylolisthesis. Skull base and vertebrae: Skull base and craniocervical junction regions appear unremarkable. No fracture is evident. There are no blastic or lytic bone lesions. Bones are osteoporotic. Soft tissues and spinal canal: Prevertebral soft tissues and predental space regions are normal. There is no paraspinous lesion. There is no evident cord or canal hematoma. Disc levels: There is marked disc space narrowing at C5-6 and C6-7. There is moderately severe disc space narrowing at C4-5, C7-T1, and the upper thoracic regions. There is multilevel facet hypertrophy. There is exit foraminal narrowing due to facet hypertrophy at C3-4 on the right, at C4-5 bilaterally, at C5-6 on the right, and at C6-7 on the right. There is no frank disc extrusion. Upper chest: There is aortic atherosclerosis. There is fibrosis in the visualized upper lung zones. Other: There is carotid artery calcification bilaterally. There is also subclavian artery calcification bilaterally. IMPRESSION: CT head: Left parietal epidural hematoma measuring 2.9 x 2.6 x 0.9 cm. No other extra-axial hemorrhage or fluid  collection. No intra-axial hemorrhage. There is atrophy, stable, with stable periventricular small vessel disease. There are foci of arterial vascular calcification. There are foci of mild paranasal sinus disease. CT cervical spine: No demonstrable fracture. Spondylolisthesis at C4-5 is felt to be due to underlying spondylosis. There is extensive multilevel arthropathy. There is aortic atherosclerosis as well as calcification in both carotid and subclavian arteries. There is fibrotic change in the upper lung zones. Critical Value/emergent results were called by telephone at the time of interpretation on 05/11/2016 at 11:43 am to Dr. Crista Curb , who verbally acknowledged these results. Electronically Signed   By: Bretta Bang III M.D.   On: 05/11/2016 11:43    EKG & Cardiac Imaging    EKG:  05/11/16  1222 Atrial flutter with 2:1 conduction, 145 bpm QTC 501 05/11/16  1258 Atrial flutter with 3:1 conduction, 71 bpm  QTC 444  Echocardiogram:  05/12/2016 Study Conclusions  - Left ventricle: The cavity size was normal. There was mild   concentric hypertrophy. Systolic function was normal. The   estimated ejection fraction was in the range of 55% to 60%. Wall   motion was normal; there were no regional wall motion   abnormalities. - Aortic valve: Trileaflet; mildly thickened, moderately calcified   leaflets. Cusp separation was reduced. Transvalvular velocity was   increased less than expected, due to low cardiac output. There   was moderate to severe stenosis. Valve area (VTI): 0.8 cm^2.   Valve area (Vmax): 0.71 cm^2. Valve area (Vmean): 0.68 cm^2. - Mitral valve: Mildly calcified annulus. Moderately calcified   leaflets . There was moderate regurgitation. - Left atrium: The atrium was moderately to severely dilated. - Tricuspid valve: There was moderate regurgitation. - Pulmonary arteries: Systolic pressure was moderately increased.   PA peak pressure: 65 mm Hg (S).   Assessment & Plan      Atrial fib/flutter -Pt found to be in atrial flutter with rapid ventricular response after a mechanical fall.  -Currently in atrial fibrillation and rate is controlled with Metoprolol 12.5 mg bid. -Echo showed normal LV function with EF 55-60%, no regional wall motion abnormality, mod-severe AS, mod MR -CHA2DS2/VAS Stroke Risk Score is at least 4 (  HTN, Age (2), female), however anticoagulation is contraindicated at present in setting of left frontal parietal extra-axial hemorrhage, including a small focus of subarachnoid hemorrhage, consider adding anticoagulation when OK with neurology/IM.  -Continue current medical therapy and follow up in the office.   HTN -Treated at home with amlodipine 10 mg daily. Continue and adjust as needed with addition of metoprolol for aflutter/fib.   Oletta Cohn, NP-C 05/13/2016, 9:52 AM Pager: [phone]  Personally seen and examined. Agree with above.  83 year old who looks much younger than stated age here post fall with parietal hematoma and found to have AFIB with RVR.  Feels well. Lungs clear, Irreg irreg, 2/6 SM LLSB, no edema  AFIB  - agree with rate control with low dose metoprolol.  - ECHO normal EF  - has risk for embolic CVA but can not anticoagulate because of ICH.  - In 6-8 weeks it may not be unreasonable to start Eliquis 2.5 BID if OK with neuro.   Moderate aortic stenosis  - murmur appreciated  - does not meet surgical criteria.   HTN  - per primary team. Low dose metoprolol. Amlodipine.   Will follow.  Donato Schultz, MD

## 2016-05-13 NOTE — Clinical Social Work Note (Signed)
Clinical Social Work Assessment  Patient Details  Name: Laurie Murphy MRN: [id number] Date of Birth: Dec 31, 1912  Date of referral:  05/13/16               Reason for consult:  Discharge Planning                Permission sought to share information with:    Permission granted to share information::     Name::        Agency::     Relationship::     Contact Information:     Housing/Transportation Living arrangements for the past 2 months:  Single Family Home Source of Information:  Patient Patient Interpreter Needed:  None Criminal Activity/Legal Involvement Pertinent to Current Situation/Hospitalization:  No - Comment as needed Significant Relationships:  Adult Children, Neighbor, Friend Lives with:  Self Do you feel safe going back to the place where you live?  Yes Need for family participation in patient care:  No (Coment)  Care giving concerns:  Pt reports no concerns at this time other than being in the hospital is a necessary  incontinence.    Social Worker assessment / plan:  Pt hospitalized on 05/11/16 under observation status from home with  Epidural hematoma. PT has recommended SNF placement. CSW met with pt at bedside to review PT recommendations and assist with d/c planning. Pt reports that she lives by herself and has limited support. Family lives in [place]. Pt reports that she will return home at d/c. " I'm not going to rehab. I'm going back to my own home. " Pt feels very strongly about returning home and will not consider rehab at this time.   Employment status:  Retired Nurse, adult PT Recommendations:  [place] / Referral to community resources:      Patient/Family's Response to care:  Pt has declined SNF at this time.  Patient/Family's Understanding of and Emotional Response to Diagnosis, Current Treatment, and Prognosis:  Pt is frustrated with herself for falling and ending up in hospital. " I have a lot to  do and hope to get home soon." Pt reports that her daughter has tried to convince her to move to [place] or accept ALF placement. " I told my daughter I was born in [place] and I'm staying in [place]. And I'm not going to ALF! " Pt has not given CSW permission to speak with daughter.   Emotional Assessment Appearance:  Appears younger than stated age Attitude/Demeanor/Rapport:  Other (cooperative) Affect (typically observed):  Appropriate, Pleasant, Calm Orientation:  Oriented to Self, Oriented to Place, Oriented to  Time, Oriented to Situation Alcohol / Substance use:  Not Applicable Psych involvement (Current and /or in the community):  No (Comment)  Discharge Needs  Concerns to be addressed:  Discharge Planning Concerns Readmission within the last 30 days:  No Current discharge risk:  None Barriers to Discharge:  No Barriers Identified   Luretha Rued, [place] 05/13/2016, 9:55 AM

## 2016-05-14 DIAGNOSIS — S064X0A Epidural hemorrhage without loss of consciousness, initial encounter: Secondary | ICD-10-CM | POA: Diagnosis not present

## 2016-05-14 DIAGNOSIS — S064X9A Epidural hemorrhage with loss of consciousness of unspecified duration, initial encounter: Secondary | ICD-10-CM | POA: Diagnosis not present

## 2016-05-14 MED ORDER — METOPROLOL TARTRATE 25 MG PO TABS: 12.5000 mg | ORAL_TABLET | Freq: Two times a day (BID) | ORAL | 0 refills | Status: AC

## 2016-05-14 MED ORDER — ACETAMINOPHEN 325 MG PO TABS: 650.0000 mg | ORAL_TABLET | Freq: Four times a day (QID) | ORAL | 0 refills | Status: AC | PRN

## 2016-05-14 MED ORDER — POLYVINYL ALCOHOL 1.4 % OP SOLN: 1.0000 [drp] | Freq: Three times a day (TID) | OPHTHALMIC | 0 refills | Status: AC | PRN

## 2016-05-14 NOTE — Discharge Summary (Signed)
Laurie Murphy, is a 83 y.o. female  DOB 1912-09-26  MRN [id number].  Admission date:  05/11/2016  Admitting Physician  Calvert Cantor, MD  Discharge Date:  05/14/2016   Primary MD  Jacquiline Doe, MD  Recommendations for primary care physician for things to follow:   CBC to f/u anemia Thyroid Function study   Admission Diagnosis  Epidural hematoma (HCC) [S06.4X9A] Typical atrial flutter (HCC) [I48.3]   Discharge Diagnosis  Epidural hematoma (HCC) [S06.4X9A] Typical atrial flutter (HCC) [I48.3]    Principal Problem:   Epidural hematoma (HCC) Active Problems:   HYPERTENSION, BENIGN SYSTEMIC   Typical atrial flutter (HCC)     #1.Epidural hematoma:   Due to mechanical fall at home.  CT - convex left parietal hematoma  2.9 x 2.6 x 0.9 cm stable on repeat imaging 4/5, presumed to be epidural. Neurosurgery, Dr. Mikal Plane recommends nonoperative management.   Hold ASA/anticoagulation for now - neurology follow-up on discharge Will need staple removal between 4/13 - 4/17 PT evaluation: recommended SNF, previously living at home alone -declined.  #2 Atrial fib/flutter:  CHA2DS2/VAS 4 High risk for embolic CVA -no anticoagulation; consider Eliquis in 6-8 weeks Present on admission Return control with metoprolol TSH: 2.116; free T4 1.14.  Echocardiogram( 05/12/2016) -EF 55-60%, moderate to severe AS, moderate MR/TR, moderate to severe LA Dilatation, moderate PASP of 65 mmHg  #3 Valvular Heart Disease-AS, MR and TR: Echo(05/12/2016) -EF 55-60%, moderate to severe AS, moderate MR/TR, moderate to severe LA Dilatation, moderate PASP of 65 mmHg. Deemed not a surgical candidate at this time    #4 Spondylosis:  CT cervical spine - no fractures, extensive multilevel arthropathy, and spondylolisthesis at C4-5   Pain control  Past Medical History:  Diagnosis Date  . Hypertension   . Shingles      History reviewed. No pertinent surgical history.     HPI  from the history and physical done on the day of admission:    Laurie Murphy is a pleasant 83 y.o. female with medical history significant of HTN who lives alone and fell when she tripped on a towel she was carrying and hit her head. She came in via EMS to be assessed and was found to have a scalp laceration, an epidural hematoma and A-flutter with RVR. She usually takes 2 baby aspirin a day but took 1 325 mg dose yesterday.  She did not have any head or neck pain, neurological changes, chest pain or palpitations. She was able to get up and walk after the fall. No recent illnesses. Remarkably healthy otherwise.  ED Course: CT head>>  Left parietal epidural hematoma measuring 2.9 x 2.6 x 0.9 cm. SVT in 140s improved after 2.5 mg IV Lopressor to 70-80s- A-fib/ flutter     Hospital Course:  Laurie Murphy a pleasant 83 y.o.femalewith history of HTN who accidentally tripped on a blanket at home sustaining a mechanical fall with resultant head injury - epidural hematoma with scalp laceration.   She was also noted to be in  A. fib/flutter with rapid ventricular response, which is apparently new. Neurosurgery recommended conservative observational management.   Cardiology consult also recommended continued rate control without any coagulation for now due to her epidural hematoma, with plans for outpatient cardiology follow-up.  Physical therapy evaluated the patient and recommended SNF disposition.However she was adamant about SNF and despite all efforts she decided to go to her own home and is discharged with Home health services.   Discharge Condition: stable  Follow UP     Consults obtained - Cardiology, Dr Donnie Aho Diet and Activity recommendation:  As advised  Discharge Instructions     Discharge Instructions    Call MD for:  difficulty breathing, headache or visual disturbances    Complete by:  As  directed    Call MD for:  extreme fatigue    Complete by:  As directed    Call MD for:  persistant dizziness or light-headedness    Complete by:  As directed    Call MD for:  persistant nausea and vomiting    Complete by:  As directed    Call MD for:  redness, tenderness, or signs of infection (pain, swelling, redness, odor or green/yellow discharge around incision site)    Complete by:  As directed    Call MD for:  severe uncontrolled pain    Complete by:  As directed    Call MD for:  temperature >100.4    Complete by:  As directed    Diet - low sodium heart healthy    Complete by:  As directed    Discharge instructions    Complete by:  As directed    Notify MD if any headaches, nausea or vomiting or mental status changes   Face-to-face encounter (required for Medicare/Medicaid patients)    Complete by:  As directed    Laurie Murphy,Laurie Murphy certify that this patient is under my care and that Laurie, or a nurse practitioner or physician's assistant working with me, had a face-to-face encounter that meets the physician face-to-face encounter requirements with this patient on 05/14/2016. The encounter with the patient was in whole, or in part for the following medical condition(s) which is the primary reason for home health care (List medical condition): Epidural Hematoma   The encounter with the patient was in whole, or in part, for the following medical condition, which is the primary reason for home health care:  Eipural hematoma   Laurie certify that, based on my findings, the following services are medically necessary home health services:   Nursing Physical therapy     Reason for Medically Necessary Home Health Services:  Skilled Nursing- Skilled Assessment/Observation   My clinical findings support the need for the above services:  Unable to leave home safely without assistance and/or assistive device   Further, Laurie certify that my clinical findings support that this patient is homebound due to:   Unable to leave home safely without assistance   Home Health    Complete by:  As directed    To provide the following care/treatments:   PT OT RN Home Health Aide Social work     Increase activity slowly    Complete by:  As directed         Discharge Medications     Allergies as of 05/14/2016   No Known Allergies     Medication List    STOP taking these medications   aspirin EC 325 MG tablet     TAKE these medications   acetaminophen  325 MG tablet Commonly known as:  TYLENOL Take 2 tablets (650 mg total) by mouth every 6 (six) hours as needed for mild pain (or Fever >/= 101).   carboxymethylcellulose 0.5 % Soln Commonly known as:  REFRESH PLUS Place 1 drop into both eyes 3 (three) times daily as needed (for dry eyes).   metoprolol tartrate 25 MG tablet Commonly known as:  LOPRESSOR Take 0.5 tablets (12.5 mg total) by mouth 2 (two) times daily.   NORVASC 10 MG tablet Generic drug:  amLODipine TAKE 1 TABLET BY MOUTH EVERY DAY   polyvinyl alcohol 1.4 % ophthalmic solution Commonly known as:  LIQUIFILM TEARS Place 1 drop into both eyes 3 (three) times daily as needed for dry eyes.   Travoprost (BAK Free) 0.004 % Soln ophthalmic solution Commonly known as:  TRAVATAN Place 1 drop into the left eye at bedtime.       Major procedures and Radiology Reports Ct Head Wo Contrast  Result Date: 05/12/2016 CLINICAL DATA:  Epidural hematoma. EXAM: CT HEAD WITHOUT CONTRAST TECHNIQUE: Contiguous axial images were obtained from the base of the skull through the vertex without intravenous contrast. COMPARISON:  Yesterday FINDINGS: Brain: Stable extra-axial hemorrhage along the left parietal convexity which measures 26 mm in length by a 9 mm in thickness. The hematoma does have a convex shape, but there is no overlying fracture or suture crossing to definitively differentiate epidural from subdural location. Tiny focus of left perirolandic subarachnoid hemorrhage is stable in  retrospect. No infarct.  Age related volume loss and Schmeling matter low-density. Vascular: Atherosclerotic calcification. Skull: Negative for fracture.  Scalp staples near the vertex. Sinuses/Orbits: No posttraumatic finding. Bilateral cataract resection. IMPRESSION: Unchanged left frontal parietal extra-axial hemorrhage, including a small focus of subarachnoid hemorrhage. No significant mass effect. No new abnormality. Electronically Signed   By: Marnee Spring M.D.   On: 05/12/2016 08:50   Ct Head Wo Contrast  Result Date: 05/11/2016 CLINICAL DATA:  Pain following fall EXAM: CT HEAD WITHOUT CONTRAST CT CERVICAL SPINE WITHOUT CONTRAST TECHNIQUE: Multidetector CT imaging of the head and cervical spine was performed following the standard protocol without intravenous contrast. Multiplanar CT image reconstructions of the cervical spine were also generated. COMPARISON:  Head CT October 01, 2010 FINDINGS: CT HEAD FINDINGS Brain: There is stable mild atrophy. Asymmetric atrophy in the left cerebellar region compared to the right is stable. There is an epidural hematoma in the left parietal region measuring 2.9 cm from superior to inferior, 2.6 cm from anterior to posterior, and 0.9 cm from right to left. No other extra-axial fluid collections are evident. There is no intra-axial mass or intra-axial hemorrhage. There is no midline shift. There is patchy small vessel disease in the centra semiovale bilaterally. Elsewhere gray-Burley compartments appear unremarkable. No evident acute infarct. Vascular: There is no hyperdense vessel. There is extensive calcification in both carotid siphon regions. Skull: Bony calvarium appears intact. Sinuses/Orbits: There is mucosal thickening in several ethmoid air cells bilaterally. There is mild mucosal thickening in the posterior right maxillary region. Other visualized paranasal sinuses are clear. Orbits appear symmetric bilaterally. Other: Mastoid air cells are clear. CT CERVICAL  SPINE FINDINGS Alignment: There is mild levoscoliosis. There is 3 mm of anterolisthesis of C4 on C5. No other appreciable spondylolisthesis. Skull base and vertebrae: Skull base and craniocervical junction regions appear unremarkable. No fracture is evident. There are no blastic or lytic bone lesions. Bones are osteoporotic. Soft tissues and spinal canal: Prevertebral soft tissues and predental space regions  are normal. There is no paraspinous lesion. There is no evident cord or canal hematoma. Disc levels: There is marked disc space narrowing at C5-6 and C6-7. There is moderately severe disc space narrowing at C4-5, C7-T1, and the upper thoracic regions. There is multilevel facet hypertrophy. There is exit foraminal narrowing due to facet hypertrophy at C3-4 on the right, at C4-5 bilaterally, at C5-6 on the right, and at C6-7 on the right. There is no frank disc extrusion. Upper chest: There is aortic atherosclerosis. There is fibrosis in the visualized upper lung zones. Other: There is carotid artery calcification bilaterally. There is also subclavian artery calcification bilaterally. IMPRESSION: CT head: Left parietal epidural hematoma measuring 2.9 x 2.6 x 0.9 cm. No other extra-axial hemorrhage or fluid collection. No intra-axial hemorrhage. There is atrophy, stable, with stable periventricular small vessel disease. There are foci of arterial vascular calcification. There are foci of mild paranasal sinus disease. CT cervical spine: No demonstrable fracture. Spondylolisthesis at C4-5 is felt to be due to underlying spondylosis. There is extensive multilevel arthropathy. There is aortic atherosclerosis as well as calcification in both carotid and subclavian arteries. There is fibrotic change in the upper lung zones. Critical Value/emergent results were called by telephone at the time of interpretation on 05/11/2016 at 11:43 am to Dr. Crista Curb , who verbally acknowledged these results. Electronically Signed   By:  Bretta Bang III M.D.   On: 05/11/2016 11:43   Ct Cervical Spine Wo Contrast  Result Date: 05/11/2016 CLINICAL DATA:  Pain following fall EXAM: CT HEAD WITHOUT CONTRAST CT CERVICAL SPINE WITHOUT CONTRAST TECHNIQUE: Multidetector CT imaging of the head and cervical spine was performed following the standard protocol without intravenous contrast. Multiplanar CT image reconstructions of the cervical spine were also generated. COMPARISON:  Head CT October 01, 2010 FINDINGS: CT HEAD FINDINGS Brain: There is stable mild atrophy. Asymmetric atrophy in the left cerebellar region compared to the right is stable. There is an epidural hematoma in the left parietal region measuring 2.9 cm from superior to inferior, 2.6 cm from anterior to posterior, and 0.9 cm from right to left. No other extra-axial fluid collections are evident. There is no intra-axial mass or intra-axial hemorrhage. There is no midline shift. There is patchy small vessel disease in the centra semiovale bilaterally. Elsewhere gray-Burges compartments appear unremarkable. No evident acute infarct. Vascular: There is no hyperdense vessel. There is extensive calcification in both carotid siphon regions. Skull: Bony calvarium appears intact. Sinuses/Orbits: There is mucosal thickening in several ethmoid air cells bilaterally. There is mild mucosal thickening in the posterior right maxillary region. Other visualized paranasal sinuses are clear. Orbits appear symmetric bilaterally. Other: Mastoid air cells are clear. CT CERVICAL SPINE FINDINGS Alignment: There is mild levoscoliosis. There is 3 mm of anterolisthesis of C4 on C5. No other appreciable spondylolisthesis. Skull base and vertebrae: Skull base and craniocervical junction regions appear unremarkable. No fracture is evident. There are no blastic or lytic bone lesions. Bones are osteoporotic. Soft tissues and spinal canal: Prevertebral soft tissues and predental space regions are normal. There is no  paraspinous lesion. There is no evident cord or canal hematoma. Disc levels: There is marked disc space narrowing at C5-6 and C6-7. There is moderately severe disc space narrowing at C4-5, C7-T1, and the upper thoracic regions. There is multilevel facet hypertrophy. There is exit foraminal narrowing due to facet hypertrophy at C3-4 on the right, at C4-5 bilaterally, at C5-6 on the right, and at C6-7 on the right.  There is no frank disc extrusion. Upper chest: There is aortic atherosclerosis. There is fibrosis in the visualized upper lung zones. Other: There is carotid artery calcification bilaterally. There is also subclavian artery calcification bilaterally. IMPRESSION: CT head: Left parietal epidural hematoma measuring 2.9 x 2.6 x 0.9 cm. No other extra-axial hemorrhage or fluid collection. No intra-axial hemorrhage. There is atrophy, stable, with stable periventricular small vessel disease. There are foci of arterial vascular calcification. There are foci of mild paranasal sinus disease. CT cervical spine: No demonstrable fracture. Spondylolisthesis at C4-5 is felt to be due to underlying spondylosis. There is extensive multilevel arthropathy. There is aortic atherosclerosis as well as calcification in both carotid and subclavian arteries. There is fibrotic change in the upper lung zones. Critical Value/emergent results were called by telephone at the time of interpretation on 05/11/2016 at 11:43 am to Dr. Crista Curb , who verbally acknowledged these results. Electronically Signed   By: Bretta Bang III M.D.   On: 05/11/2016 11:43    Micro Results     Recent Results (from the past 240 hour(s))  MRSA PCR Screening     Status: None   Collection Time: 05/11/16  9:29 PM  Result Value Ref Range Status   MRSA by PCR NEGATIVE NEGATIVE Final    Comment:        The GeneXpert MRSA Assay (FDA approved for NASAL specimens only), is one component of a comprehensive MRSA colonization surveillance program. It  is not intended to diagnose MRSA infection nor to guide or monitor treatment for MRSA infections.        Today   Subjective    Laurie Murphy was seen today. No dizziness, no fever or chills, no sob.  Patient has been seen and examined prior to discharge   Objective   Blood pressure (!) 148/82, pulse 69, temperature 98.2 F (36.8 C), temperature source Oral, resp. rate (!) 22, height 5\' 5"  (1.651 m), weight 54.4 kg (120 lb), SpO2 96 %.   Intake/Output Summary (Last 24 hours) at 05/14/16 1211 Last data filed at 05/14/16 0930  Gross per 24 hour  Intake               60 ml  Output              700 ml  Net             -640 ml    Exam Gen:- Awake. NAD HEENT:- [place].AT,MMM Neck-Supple Neck,No JVD,  Lungs- CTA CV- IRRR S1, S2 normal Abd-  +ve B.Sounds, Abd Soft, No tenderness,    Extremity/Skin:- Intact, no cyanosis  CBC w Diff: Lab Results  Component Value Date   WBC 7.1 05/13/2016   HGB 10.5 (L) 05/13/2016   HCT 31.6 (L) 05/13/2016   PLT 133 (L) 05/13/2016   LYMPHOPCT 18 05/11/2016   MONOPCT 6 05/11/2016   EOSPCT 0 05/11/2016   BASOPCT 0 05/11/2016    CMP: Lab Results  Component Value Date   NA 139 05/13/2016   K 4.3 05/13/2016   CL 109 05/13/2016   CO2 26 05/13/2016   BUN 20 05/13/2016   CREATININE 0.93 05/13/2016   CREATININE 1.00 01/17/2012   PROT 7.1 01/17/2012   ALBUMIN 3.7 01/17/2012   BILITOT 0.4 01/17/2012   ALKPHOS 68 01/17/2012   AST 20 01/17/2012   ALT 9 01/17/2012  .   Total Discharge time is about 33 minutes  Murphy,Laurie Murphy M.D on 05/14/2016 at 12:11 PM  [facility]  Office  [phone]  Dragon dictation system was used to create this note, attempts have been made to correct errors, however presence of uncorrected errors is not a reflection quality of care provided

## 2016-05-14 NOTE — Progress Notes (Signed)
qPhysical Therapy Treatment Patient Details Name: Laurie Murphy MRN: [id number] DOB: 03/03/1912 Today's Date: 05/14/2016    History of Present Illness 84 y.o. female with medical history significant of HTN who lives alone and fell when she tripped on a towel she was carrying and hit her head.  Pt admitted with scalp laceration and Left parietal epidural hematoma     PT Comments    Pt very happy to ambulate and performed well with 2 HHA.  Pt more steady today with HHA however declined using RW.  Per RN, pt refusing SNF.  If home, would benefit from RW however pt likely will not be agreeable.  Follow Up Recommendations  Supervision/Assistance - 24 hour;SNF (if 24/7 assist available then HHPT, if not then SNF)     Equipment Recommendations  Rolling walker with 5" wheels (if pt does not refuse)    Recommendations for Other Services       Precautions / Restrictions Precautions Precautions: Fall Precaution Comments: monitor HR Restrictions Weight Bearing Restrictions: No    Mobility  Bed Mobility               General bed mobility comments: pt up in recliner on arrival  Transfers Overall transfer level: Needs assistance Equipment used: None Transfers: Sit to/from Stand Sit to Stand: Min guard         General transfer comment: slow to rise and able to self steady, min/guard for safety  Ambulation/Gait Ambulation/Gait assistance: Min guard Ambulation Distance (Feet): 400 Feet Assistive device: 2 person hand held assist Gait Pattern/deviations: Step-through pattern;Wide base of support     General Gait Details: provided HHA today and pt more steady (she would not use RW however), pt with extremely itchy feet she believed from her socks so took a seated rest break to apply lotion and changed to shoe covers since pt would not wear socks, tolerated distance well   Stairs            Wheelchair Mobility    Modified Rankin (Stroke Patients Only)        Balance Overall balance assessment: Needs assistance;History of Falls         Standing balance support: No upper extremity supported Standing balance-Leahy Scale: Fair Standing balance comment: able stand statically without assist or support however does not tolerate challenges                            Cognition Arousal/Alertness: Awake/alert Behavior During Therapy: WFL for tasks assessed/performed Overall Cognitive Status: Within Functional Limits for tasks assessed                                        Exercises      General Comments        Pertinent Vitals/Pain Pain Assessment: No/denies pain    Home Living Family/patient expects to be discharged to:: Private residence Living Arrangements: Alone           Home Equipment: None      Prior Function Level of Independence: Independent          PT Goals (current goals can now be found in the care plan section) Acute Rehab PT Goals Patient Stated Goal: home today Progress towards PT goals: Progressing toward goals    Frequency    Min 3X/week      PT Plan Current  plan remains appropriate    Co-evaluation             End of Session Equipment Utilized During Treatment: Gait belt Activity Tolerance: Patient tolerated treatment well Patient left: with call bell/phone within reach;in chair;with chair alarm set   PT Visit Diagnosis: Unsteadiness on feet (R26.81);History of falling (Z91.81)     Time: 2440-1027 PT Time Calculation (min) (ACUTE ONLY): 15 min  Charges:  $Gait Training: 8-22 mins                    G Codes:       Zenovia Jarred, PT, DPT 05/14/2016 Pager: [phone]    Maida Sale E 05/14/2016, 12:50 PM

## 2016-05-14 NOTE — Care Management Note (Signed)
MEDICARE OBSERVATION STATUS NOTIFICATION   Patient Details  Name: Laurie Murphy MRN: [id number] Date of Birth: [birth year]   Medicare Observation Status Notification Given: yes      Isaias Cowman, RN 05/14/2016, 2:57 PM

## 2016-05-14 NOTE — Progress Notes (Signed)
Subjective:  No complaints and sitting up in chair at side of bed  Objective:  Vital Signs in the last 24 hours: BP (!) 148/82   Pulse 69   Temp 98.2 F (36.8 C) (Oral)   Resp (!) 22   Ht  (1.651 m)   Wt 54.4 kg (120 lb)   SpO2 96%   BMI 19.97 kg/m   Physical Exam: Very thin elderly black female in no acute distress Lungs:  Clear  Cardiac:  Irregular rhythm, normal S1 and S2, no S3, 2/6 systolic murmur over aortic valve consistent with aortic stenosis Extremities:  No edema present  Intake/Output from previous day: 04/06 0701 - 04/07 0700 In: -  Out: 700 [Urine:700] Weight Filed Weights   05/11/16 0954  Weight: 54.4 kg (120 lb)    Lab Results: Basic Metabolic Panel:  Recent Labs  16/10/96 0311 05/13/16 0313  NA 138 139  K 3.8 4.3  CL 106 109  CO2 25 26  GLUCOSE 143* 101*  BUN 16 20  CREATININE 0.77 0.93    CBC:  Recent Labs  05/11/16 1213 05/12/16 0311 05/13/16 0313  WBC 6.8 7.7 7.1  NEUTROABS 5.1  --   --   HGB 11.8* 11.6* 10.5*  HCT 35.7* 34.7* 31.6*  MCV 88.1 89.9 89.3  PLT 150 128* 133*    Telemetry: Atrial fibrillation with controlled response  Assessment/Plan:  1.  Atrial fibrillation with controlled response not symptomatic-not candidate for anticoagulation due to recent hemorrhage 2.  Recent subdural hematoma and hemorrhage 3.  Moderate aortic stenosis 4.  Moderate pulmonary hypertension 5.  Hypertensive heart disease  Recommendations:  Continue rate control with metoprolol, advanced therapy.  No anticoagulation for now but could revisit down the road.     Darden Palmer  MD [facility]  05/14/2016, 9:08 AM

## 2016-05-14 NOTE — Care Management Note (Signed)
83 yo F fell when she tripped on a towel she was carrying and hit her head. Pt admitted with scalp laceration and L parietal epidural hematoma.  Received referral to assist with HHPT. Pt is adamant about SNF.  Met with pt at bedside. Discussed D/C plan and HH (PT, RN, SW, aide). She is adamant about HH. Discussed with pt the importance of a safe d/c plan. She stated that she has the support of friends and her next door neighbor. She declined HH. She reports that is not appropriate for her at this time and is not Administrator, arts. She reports that she walks with her legs and she doesn't need any DME.

## 2016-05-14 NOTE — Evaluation (Signed)
Occupational Therapy Evaluation Patient Details Name: Laurie Murphy MRN: [id number] DOB: 1912/09/04 Today's Date: 05/14/2016    History of Present Illness 83 y.o. female with medical history significant of HTN who lives alone and fell when she tripped on a towel she was carrying and hit her head.  Pt admitted with scalp laceration and Left parietal epidural hematoma    Clinical Impression   Pt was admitted for the above. At baseline, she is independent, although she doesn't cook.  She currently needs min A for ADLs. She will benefit from continued OT to increase safety and independence with adls.  Goals in acute are for supervision level    Follow Up Recommendations  Supervision/Assistance - 24 hour;Home health OT (or as much supervision as pt can have, if she allows this; refuses SNF)    Equipment Recommendations  None recommended by OT (doesn't want DME for tub)    Recommendations for Other Services       Precautions / Restrictions Precautions Precautions: Fall Precaution Comments: monitor HR Restrictions Weight Bearing Restrictions: No      Mobility Bed Mobility              oob    Transfers            sit to stand:  Min guard for safety          Balance                                           ADL either performed or assessed with clinical judgement   ADL Overall ADL's : Needs assistance/impaired Eating/Feeding: Independent   Grooming: Sitting;Minimal assistance Grooming Details (indicate cue type and reason): to maneuver several items including emesis basin, cup, toothbrush and tissue.  Pt brushed several times and didn't want to set toothbrush down Upper Body Bathing: Set up;Sitting   Lower Body Bathing: Min guard;Sit to/from stand   Upper Body Dressing : Minimal assistance;Sitting Upper Body Dressing Details (indicate cue type and reason): gown with tele monitor and to fasten Lower Body Dressing: Minimal assistance;Sit  to/from stand                 General ADL Comments: only performed sit to stand.  Pt did not need to use bathroom. Changed gown and performed hygiene. Pt did not like temperature of our hot water, so she didn't want to bathe this am.  Pt tends to like to do things her way. She asked for assistance this session.  Likely a combination of different set up, decreased endurance and having assistance available.  She did not want to stand at sink for teeth today. She brushed for a long time, but if she were standing, she likely would have been able to maneuver items without assistance.  She has a high commode and tub/shower at home. She stands in shower and doesn't want any modifications. She states that she doesn't shower every day.       Vision         Perception     Praxis      Pertinent Vitals/Pain Pain Assessment: No/denies pain     Hand Dominance     Extremity/Trunk Assessment Upper Extremity Assessment Upper Extremity Assessment: Overall WFL for tasks assessed           Communication Communication Communication: HOH   Cognition Arousal/Alertness: Awake/alert Behavior During Therapy:  WFL for tasks assessed/performed Overall Cognitive Status: Within Functional Limits for tasks assessed                                     General Comments       Exercises     Shoulder Instructions      Home Living Family/patient expects to be discharged to:: Private residence Living Arrangements: Alone                 Bathroom Shower/Tub: Chief Strategy Officer: Handicapped height     Home Equipment: None          Prior Functioning/Environment Level of Independence: Independent                 OT Problem List: Decreased strength;Impaired balance (sitting and/or standing);Decreased activity tolerance      OT Treatment/Interventions: Self-care/ADL training;DME and/or AE instruction;Therapeutic activities;Balance  training;Patient/family education    OT Goals(Current goals can be found in the care plan section) Acute Rehab OT Goals Patient Stated Goal: home today OT Goal Formulation: With patient Time For Goal Achievement: 05/21/16 Potential to Achieve Goals: Good ADL Goals Pt Will Transfer to Toilet: with supervision;bedside commode;ambulating Pt Will Perform Toileting - Clothing Manipulation and hygiene: with supervision;sit to/from stand Additional ADL Goal #1: pt will gather clothes at supervision level and complete ADL without assistance or cues  OT Frequency: Min 2X/week   Barriers to D/C:            Co-evaluation              End of Session    Activity Tolerance: Patient tolerated treatment well Patient left: in chair;with call bell/phone within reach;with chair alarm set  OT Visit Diagnosis: Unsteadiness on feet (R26.81)                Time: 3244-0102 OT Time Calculation (min): 26 min Charges:  OT General Charges $OT Visit: 1 Procedure OT Evaluation $OT Eval Moderate Complexity: 1 Procedure OT Treatments $Self Care/Home Management : 8-22 mins G-Codes: OT G-codes **NOT FOR INPATIENT CLASS** Functional Assessment Tool Used: Clinical judgement Functional Limitation: Self care Self Care Current Status (V2536): At least 20 percent but less than 40 percent impaired, limited or restricted Self Care Goal Status (U4403): At least 1 percent but less than 20 percent impaired, limited or restricted   Marica Otter, OTR/L [phone] 05/14/2016  [place] Mielke 05/14/2016, 12:21 PM

## 2016-05-25 ENCOUNTER — Ambulatory Visit (INDEPENDENT_AMBULATORY_CARE_PROVIDER_SITE_OTHER): Payer: Medicare Other | Admitting: Family Medicine

## 2016-05-25 ENCOUNTER — Encounter: Payer: Self-pay | Admitting: Family Medicine

## 2016-05-25 DIAGNOSIS — R600 Localized edema: Secondary | ICD-10-CM | POA: Diagnosis not present

## 2016-05-25 DIAGNOSIS — I10 Essential (primary) hypertension: Secondary | ICD-10-CM | POA: Diagnosis not present

## 2016-05-25 NOTE — Patient Instructions (Signed)
Try using compression hoses for your legs.  Let us know if it is not getting better.  Come back to see me in 3-6 months.  Take care,  Dr Jimmey Ralph

## 2016-05-25 NOTE — Assessment & Plan Note (Signed)
At goal - fall seems to be more mechanical in nature. No syncopal or orthostatic symptoms. Will continue current regimen.

## 2016-05-25 NOTE — Progress Notes (Signed)
    Subjective:  Laurie Murphy is a 83 y.o. female who presents to the [facility] today with a chief complaint of hospital follow up for fall.   HPI:  Mechanical Fall Patient suffered a fall last week and was admitted to [facility]. Patient says that she tripped over a blanket that she was using to keep herself warm. She was found to have an epidural hematoma. Neurosurgery was consulted and did not recommend any further work up.  Afib Patient also found to be in afib with RVR on admission. She had a normal TSH and echocardiogram.   Hypertension BP Readings from Last 3 Encounters:  05/25/16 112/60  05/14/16 103/66  04/12/13 (!) 150/74   Home BP monitoring-Yes Compliant with medications-yes without side effects ROS-Denies any CP, HA, SOB, blurry vision, transient weakness, orthopnea, PND.   LE Edema: Patient also noticed increased edema in her LE over the past few days. No shortness of breath or chest pain. She has tried elevated her legs but has not been able to do this very much. No other treatments tried.   ROS: Per HPI  PMH: Smoking history reviewed.    Objective:  Physical Exam: BP 112/60 (BP Location: Right Arm, Patient Position: Sitting)   Pulse 89   Temp 97.8 F (36.6 C) (Oral)   Ht  (1.626 m)   Wt 112 lb 3.2 oz (50.9 kg)   SpO2 90%   BMI 19.26 kg/m   Gen: 83yo F in NAD, resting comfortably HEENT: Small laceration on posterior aspect of head well healed with small amount of dried blood.  CV: RRR with no murmurs appreciated Pulm: NWOB, CTAB with no crackles, wheezes, or rhonchi GI: Normal bowel sounds present. Soft, Nontender, Nondistended. MSK: no edema, cyanosis, or clubbing noted Skin: warm, dry Neuro: grossly normal, moves all extremities Psych: Normal affect and thought content  Assessment/Plan:  HYPERTENSION, BENIGN SYSTEMIC At goal - fall seems to be more mechanical in nature. No syncopal or orthostatic symptoms. Will continue current  regimen.   Leg edema Likely secondary to venous insufficiency.May also be a side effect of norvasc. Recent echo normal. Discussed conservative management for now with elevation and compression stockings.   Scalp Laceration 3 staples removed today. Appears to be well healed. Follow up as needed.   Katina Degree. Jimmey Ralph, MD [facility] Resident PGY-3 05/25/2016 1:42 PM

## 2016-05-25 NOTE — Assessment & Plan Note (Signed)
Likely secondary to venous insufficiency.May also be a side effect of norvasc. Recent echo normal. Discussed conservative management for now with elevation and compression stockings.

## 2016-07-29 ENCOUNTER — Other Ambulatory Visit: Payer: Self-pay | Admitting: *Deleted

## 2016-08-01 MED ORDER — AMLODIPINE BESYLATE 10 MG PO TABS: 10.0000 mg | ORAL_TABLET | Freq: Every day | ORAL | 5 refills | Status: DC

## 2016-08-01 NOTE — Telephone Encounter (Signed)
2nd request.  Adalea Handler L, RN  

## 2016-10-13 ENCOUNTER — Ambulatory Visit (INDEPENDENT_AMBULATORY_CARE_PROVIDER_SITE_OTHER): Payer: Medicare Other | Admitting: Podiatry

## 2016-10-13 ENCOUNTER — Ambulatory Visit (INDEPENDENT_AMBULATORY_CARE_PROVIDER_SITE_OTHER): Payer: Medicare Other

## 2016-10-13 DIAGNOSIS — M79672 Pain in left foot: Secondary | ICD-10-CM

## 2016-10-13 DIAGNOSIS — M79671 Pain in right foot: Secondary | ICD-10-CM | POA: Diagnosis not present

## 2016-10-13 DIAGNOSIS — B351 Tinea unguium: Secondary | ICD-10-CM | POA: Diagnosis not present

## 2016-10-13 DIAGNOSIS — M79609 Pain in unspecified limb: Secondary | ICD-10-CM | POA: Diagnosis not present

## 2016-10-13 DIAGNOSIS — S80821A Blister (nonthermal), right lower leg, initial encounter: Secondary | ICD-10-CM

## 2016-10-18 NOTE — Progress Notes (Signed)
   Subjective:    Patient ID: Laurie Murphy, female    DOB: 1912/09/08, [age] y.o.   MRN: [id number]  HPI [age] y.o. female presents for complaint of painful toenails to both feet. Denies diabetes. Reports a blister that formed in her R leg that her doctor wanted her to get checked out.  Past Medical History:  Diagnosis Date  . Hypertension   . Shingles    No past surgical history on file.  Current Outpatient Prescriptions:  .  acetaminophen (TYLENOL) 325 MG tablet, Take 2 tablets (650 mg total) by mouth every 6 (six) hours as needed for mild pain (or Fever >/= 101)., Disp: 30 tablet, Rfl: 0 .  amLODipine (NORVASC) 10 MG tablet, Take 1 tablet (10 mg total) by mouth daily., Disp: 30 tablet, Rfl: 5 .  carboxymethylcellulose (REFRESH PLUS) 0.5 % SOLN, Place 1 drop into both eyes 3 (three) times daily as needed (for dry eyes)., Disp: , Rfl:  .  metoprolol tartrate (LOPRESSOR) 25 MG tablet, Take 0.5 tablets (12.5 mg total) by mouth 2 (two) times daily., Disp: 30 tablet, Rfl: 0 .  polyvinyl alcohol (LIQUIFILM TEARS) 1.4 % ophthalmic solution, Place 1 drop into both eyes 3 (three) times daily as needed for dry eyes., Disp: 15 mL, Rfl: 0 .  Travoprost, BAK Free, (TRAVATAMN) 0.004 % SOLN ophthalmic solution, Place 1 drop into the left eye at bedtime.  , Disp: , Rfl:   No Known Allergies   Review of Systems     Objective:   Physical Exam There were no vitals filed for this visit. General AA&O x3. Normal mood and affect.  Vascular Dorsalis pedis and posterior tibial pulses  present 1+ bilaterally  Capillary refill normal to all digits. Pedal hair growth normal.  Neurologic Epicritic sensation grossly present.  Dermatologic No open lesions. Interspaces clear of maceration. Nails x10 elongated and thickened with yellow discoloration.  Small bulla R distal tibia with clear serous drainage.  Orthopedic: MMT 5/5 in dorsiflexion, plantarflexion, inversion, and eversion. Normal joint ROM without  pain or crepitus.     Assessment & Plan:  Onychomycosis with Pain -Nails debrided as below.  Procedure: Nail Debridement Rationale: pain Type of Debridement: manual, sharp debridement. Instrumentation: Nail nipper, rotary burr. Number of Nails: 10  Blister RLE -Blister with clear serous drainage noted. -Roof of blister left intact. Band-aid applied. -Educated on signs of infection.  Follow up PRN

## 2016-11-02 ENCOUNTER — Encounter: Payer: Self-pay | Admitting: Family Medicine

## 2016-11-02 ENCOUNTER — Ambulatory Visit (INDEPENDENT_AMBULATORY_CARE_PROVIDER_SITE_OTHER): Payer: Medicare Other | Admitting: Family Medicine

## 2016-11-02 ENCOUNTER — Ambulatory Visit (INDEPENDENT_AMBULATORY_CARE_PROVIDER_SITE_OTHER): Payer: Medicare Other | Admitting: Podiatry

## 2016-11-02 DIAGNOSIS — I10 Essential (primary) hypertension: Secondary | ICD-10-CM

## 2016-11-02 DIAGNOSIS — Z9181 History of falling: Secondary | ICD-10-CM | POA: Diagnosis not present

## 2016-11-02 DIAGNOSIS — I872 Venous insufficiency (chronic) (peripheral): Secondary | ICD-10-CM

## 2016-11-02 DIAGNOSIS — B353 Tinea pedis: Secondary | ICD-10-CM | POA: Diagnosis not present

## 2016-11-02 MED ORDER — KETOCONAZOLE 2 % EX CREA: TOPICAL_CREAM | Freq: Every day | CUTANEOUS | Status: DC

## 2016-11-02 NOTE — Progress Notes (Signed)
   Subjective:    Patient ID: Laurie Murphy is a 83 y.o. female presenting with No chief complaint on file.  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 Patient fell on Monday evening and was not able to get up. Care giver came on got her up on Tuesday. She had to call a locksmith to open her door. Patient denies pain or any lingering issues related to fall. Care giver sees her Rica Mote, Lucia Bitter, Thurs am. As far as I can glean there are no other regular care givers. Patient fell in April and had a subdural hemorrhage, which did not require surgical intervention. Daughter in [place]. Lives in single family dwelling. She has no life alert, walker, cane.  Review of Systems  Constitutional: Negative for chills and fever.  Respiratory: Negative for shortness of breath.   Cardiovascular: Negative for chest pain.  Gastrointestinal: Negative for abdominal pain, nausea and vomiting.  Genitourinary: Negative for dysuria.  Skin: Negative for rash.      Objective:    Blood pressure 110/68, pulse 94, temperature 98 F (36.7 C), temperature source Oral, height  (1.626 m), weight 114 lb 12.8 oz (52.1 kg).  Physical Exam  Constitutional: She is oriented to person, place, and time. She appears cachectic. No distress.  HENT:  Head: Normocephalic and atraumatic.  Eyes: No scleral icterus.  Neck: Neck supple.  Cardiovascular: Normal rate.   Pulmonary/Chest: Effort normal.  Abdominal: Soft.  Musculoskeletal: She exhibits edema.  Neurological: She is alert and oriented to person, place, and time.  Skin: Skin is warm and dry.  Psychiatric: She has a normal mood and affect.        Assessment & Plan:   Problem List Items Addressed This Visit      Unprioritized   HYPERTENSION, BENIGN SYSTEMIC    BP at goal or a little low today      At high risk for injury related to fall    Lengthy discussion with patient about her living situation,  risks of falls and risks of not being found for several days. Offered walker, cane, life alert, in home care, assisted living move. Offered to call her daughter to discuss options. The patient was very reluctant to discuss these issues. Offered counseling around living will and end of life, but again she would not really talk about it. She offered that these were not questions she had easy answers for and she wanted to discuss this with someone else, though she failed to identify this person. Strongly advised against my calling her daughter. She is brought in with a care giver today, who is a friend, but not relative. She volunteers to help care for the patient. Patient reports that she and someone else are looking at alternatives for her but did not give any clue as to the time frame of this decision. We did discuss proper nutrition and hydration and diminishing fall risk at home.         Total face-to-face time with patient: 25 minutes. Over 50% of encounter was spent on counseling and coordination of care. Return if symptoms worsen or fail to improve.  Reva Bores 11/02/2016 3:28 PM

## 2016-11-02 NOTE — Assessment & Plan Note (Signed)
BP at goal or a little low today

## 2016-11-02 NOTE — Assessment & Plan Note (Addendum)
Lengthy discussion with patient about her living situation, risks of falls and risks of not being found for several days. Offered walker, cane, life alert, in home care, assisted living move. Offered to call her daughter to discuss options. The patient was very reluctant to discuss these issues. Offered counseling around living will and end of life, but again she would not really talk about it. She offered that these were not questions she had easy answers for and she wanted to discuss this with someone else, though she failed to identify this person. Strongly advised against my calling her daughter. She is brought in with a care giver today, who is a friend, but not relative. She volunteers to help care for the patient. Patient reports that she and someone else are looking at alternatives for her but did not give any clue as to the time frame of this decision. We did discuss proper nutrition and hydration and diminishing fall risk at home.

## 2016-11-02 NOTE — Progress Notes (Signed)
   Subjective:    Patient ID: Laurie Murphy, female    DOB: [birth year], 83 y.o.   MRN: [id number]  HPI  83 year old female returns for follow up of both leg swelling. Reports swelling and blistering has not improved. Reports new complaint of scaling to the bottom of both feet. Denies other issues.  Review of Systems     Objective:   Physical Exam There were no vitals filed for this visit. General AA&O x3. Normal mood and affect.  Vascular Dorsalis pedis and posterior tibial pulses  present 2+ bilaterally  Capillary refill normal to all digits. Pedal hair growth absent.  Neurologic Epicritic sensation grossly present.  Dermatologic No open lesions. Interspaces clear of maceration. Nails thickened and dystrophic. Thin shiny skin bilat with small areas of serous blistering. Pitting edema noted bilat. Xerosis with scaling bilat.  Orthopedic: MMT 5/5 in dorsiflexion, plantarflexion, inversion, and eversion. Normal joint ROM without pain or crepitus.      Assessment & Plan:  Patient was evaluated and treated and all questions answered.  Venous Insufficiency, Bilat -Educated on etiology. -ACE bandages applied bilat. -Advised on OTC compression socks.  Tinea Pedis, Bilat -eRx Ketoconazole. Instructed on application.  Return in about 4 weeks (around 11/30/2016).

## 2016-11-02 NOTE — Patient Instructions (Signed)

## 2016-11-03 ENCOUNTER — Telehealth: Payer: Self-pay | Admitting: Podiatry

## 2016-11-03 ENCOUNTER — Ambulatory Visit: Payer: Medicare Other | Admitting: Podiatry

## 2016-11-03 MED ORDER — KETOCONAZOLE 2 % EX CREA: 1.0000 "application " | TOPICAL_CREAM | Freq: Every day | CUTANEOUS | 0 refills | Status: AC

## 2016-11-03 NOTE — Telephone Encounter (Signed)
Laurie Murphy was seen yesterday and Dr. Samuella Cota stated he was going to send a medication to her pharmacy. They do not have anything. Also, he wrapped her legs and told her to get compression socks/hose. She is wanting to know if she can unwrap her legs to put the cream on. Please call me back at [phone] [phone]. Thank you.

## 2016-11-03 NOTE — Addendum Note (Signed)
Addended by: Marylou Mccoy on: 11/03/2016 04:26 PM   Modules accepted: Orders

## 2016-11-10 ENCOUNTER — Ambulatory Visit (INDEPENDENT_AMBULATORY_CARE_PROVIDER_SITE_OTHER): Payer: Medicare Other | Admitting: Podiatry

## 2016-11-10 DIAGNOSIS — I872 Venous insufficiency (chronic) (peripheral): Secondary | ICD-10-CM

## 2016-11-10 DIAGNOSIS — M21611 Bunion of right foot: Secondary | ICD-10-CM

## 2016-11-10 DIAGNOSIS — M2011 Hallux valgus (acquired), right foot: Secondary | ICD-10-CM

## 2016-11-11 ENCOUNTER — Telehealth: Payer: Self-pay | Admitting: *Deleted

## 2016-11-11 NOTE — Telephone Encounter (Signed)
Called patient's close friend Jacques Earthly and was calling to check to see if any one had her back about the message that was left on 11-03-16 to [employer] or myself (Anayah Arvanitis) about a RX that they had not gotten yet and to call the [place] Office at [phone]. Misty Stanley

## 2016-11-14 NOTE — Telephone Encounter (Signed)
Called and left a message on 11/11/2016  for the patient to call back just to make sure they got the questions answered. Laurie Murphy

## 2016-11-21 ENCOUNTER — Telehealth: Payer: Self-pay | Admitting: Podiatry

## 2016-11-22 NOTE — Telephone Encounter (Signed)
Left message informing pt that I felt she would benefit from coming in earlier than her 11/24/2016 appt, to please make an appt to see Dr. Ardelle Anton tomorrow.

## 2016-11-22 NOTE — Telephone Encounter (Signed)
This is [employer], pt's daughter. I made an appointment for her to see Dr. Samuella Cota Thursday morning for the bunion he has been treating her for. She stated that it feels like it has erupted or like something is coming out of it and she is very concerned about it. Wants to know if there is something she can put on it between now and when she comes in on Thursday. I will ask you to call her as I live in [place]. You can reach her at [phone]. If you need to call me, you can reach me at [phone].

## 2016-11-24 ENCOUNTER — Ambulatory Visit (INDEPENDENT_AMBULATORY_CARE_PROVIDER_SITE_OTHER): Payer: Medicare Other | Admitting: Internal Medicine

## 2016-11-24 ENCOUNTER — Encounter: Payer: Self-pay | Admitting: Podiatry

## 2016-11-24 ENCOUNTER — Ambulatory Visit (INDEPENDENT_AMBULATORY_CARE_PROVIDER_SITE_OTHER): Payer: Medicare Other | Admitting: Podiatry

## 2016-11-24 DIAGNOSIS — R600 Localized edema: Secondary | ICD-10-CM

## 2016-11-24 DIAGNOSIS — S90421A Blister (nonthermal), right great toe, initial encounter: Secondary | ICD-10-CM | POA: Diagnosis not present

## 2016-11-24 DIAGNOSIS — M2011 Hallux valgus (acquired), right foot: Secondary | ICD-10-CM | POA: Diagnosis not present

## 2016-11-24 DIAGNOSIS — M21611 Bunion of right foot: Secondary | ICD-10-CM

## 2016-11-24 NOTE — Progress Notes (Signed)
   Subjective:   Patient: Laurie Murphy       Birthdate: August 05, 1912       MRN: [id number]      HPI  Laurie Murphy is a [age] y.o. female presenting for walk in appt for LE edema.   LE edema Patient was seen by her podiatrist (Dr. Ventura SellersMichael Price) today who was concerned about the amount of edema he noticed. Patient was seen by podiatrist last week as well, and he felt today that her edema had increased significantly since that time. Dr. Samuella CotaPrice prescribed fitted compression stockings today, however wanted to have her checked out before she began wearing them. Patient's daughter says that patient has been complaining of worsening edema over the past two weeks, however edema is not a new issue for her. Patient was told she had venous insufficiency after being evaluated at our office in the spring and was recommended to wear compression stockings. Patient's daughter reports that they thought she was wearing compression stockings, but in actuality has just been wearing fairly loose short compression socks. Patient denies SOB, orthopnea, PND. Minimal coughing but this is not new. Patient denies pain in her legs.   Smoking status reviewed. Patient is never smoker.   Review of Systems See HPI.     Objective:  Physical Exam  Constitutional: She is oriented to person, place, and time.  Pleasant elderly female in NAD  HENT:  Head: Normocephalic and atraumatic.  Cardiovascular: Normal rate and normal heart sounds.   No murmur heard. Irregularly irregular  Pulmonary/Chest: Breath sounds normal. No respiratory distress. She has no wheezes. She has no rales.  Normal WOB on RA. Speaking in full sentences.   Musculoskeletal:  1-2+ pitting edema to knees bilaterally. No TTP, no palpable cords.   Neurological: She is alert and oriented to person, place, and time.  Skin:  Skin changes consistent with chronic venous changes.   Psychiatric: Affect and judgment normal.      Assessment & Plan:  Leg  edema Most consistent with gravity-dependent edema due to venous insufficiency. Less likely cardiac etiology, as patient denying SOB or signs of pulmonary edema, and with normal echo in the spring. Also lungs CTAB. Irregular rhythm, however patient with known a.fib. Patient has not been wearing compression stockings as recommended. Suspect that, especially as she has now been prescribed fitted compression stockings by her podiatrist, her edema will significantly improve. No indication to begin diuretics at this time and patient also wishing to avoid diuretics if at all possible. Will continue to monitor closely, especially for signs of respiratory changes.   Tarri AbernethyAbigail J Taheera Thomann, MD, MPH PGY-3 [facility] Pager [phone]

## 2016-11-24 NOTE — Patient Instructions (Signed)
It was nice meeting you and your daughter today Laurie Murphy!  The fluid build-up in your legs is most likely due to venous insufficiency. This means that your veins are not able to pump fluid out of your feet and legs as well as they used to (a normal side effect of getting older), so fluid builds up. I do not think this is due to your heart, as the ultrasound of your heart in April showed no problems with your heart other than the irregular beat. I think the compression stockings prescribed by Dr. Samuella CotaPrice will be very helpful.   If you develop shortness of breath, or if you find that the compression stockings are not helping, please schedule another appointment.   If you have any questions or concerns, please feel free to call the clinic.   Be well,  Dr. Natale MilchLancaster

## 2016-11-25 ENCOUNTER — Telehealth: Payer: Self-pay | Admitting: Family Medicine

## 2016-11-25 ENCOUNTER — Encounter: Payer: Self-pay | Admitting: Internal Medicine

## 2016-11-25 NOTE — Assessment & Plan Note (Addendum)
Most consistent with gravity-dependent edema due to venous insufficiency. Less likely cardiac etiology, as patient denying SOB or signs of pulmonary edema, and with normal echo in the spring. Also lungs CTAB. Irregular rhythm, however patient with known a.fib. Patient has not been wearing compression stockings as recommended. Suspect that, especially as she has now been prescribed fitted compression stockings by her podiatrist, her edema will significantly improve. No indication to begin diuretics at this time and patient also wishing to avoid diuretics if at all possible. Will continue to monitor closely, especially for signs of respiratory changes.

## 2016-11-25 NOTE — Telephone Encounter (Signed)
Daughter wanted to report pts hands and feet are swellling at night and then in the morning, they are normal size.  Just felt the dr should know this. Please advise  [phone]  Laurie Murphy howard-daughter

## 2016-11-28 NOTE — Progress Notes (Signed)
   Subjective:    Patient ID: Gregary SignsGladys F Rubis, female    DOB: 11-Aug-1912, [age] y.o.   MRN: [id number] Chief Complaint  Patient presents with  . Bunions    right foot - bunion has a blister x 1 week. denies pain     HPI  83 year old female returns for complaint of R foot blister. States that the blister started a week ago. Denies treatment. Has not worn compression socks for swelling. Denies other issues.  New at this visit swelling of both upper extremities noted.  Review of Systems     Objective:   Physical Exam There were no vitals filed for this visit. General AA&O x3. Normal mood and affect.  Vascular Dorsalis pedis and posterior tibial pulses  present 2+ bilaterally  Capillary refill normal to all digits. Pedal hair growth absent.  Neurologic Epicritic sensation grossly present.  Dermatologic R 1st MPJ with blister with serous drainage.  Orthopedic: MMT 5/5 in dorsiflexion, plantarflexion, inversion, and eversion. Normal joint ROM without pain or crepitus. HAV deformity bilat.      Assessment & Plan:  Patient was evaluated and treated and all questions answered.  Bunion R Foot with overlying Blister -Offloaded with tubefoam. -Betadine applied to blister.  Venous Insufficiency -Recommend f/u with PCP for swelling of both legs and arms.  Return in about 2 weeks (around 12/08/2016) for check bunions.

## 2016-11-30 ENCOUNTER — Ambulatory Visit: Payer: Medicare Other | Admitting: Podiatry

## 2016-12-02 ENCOUNTER — Ambulatory Visit (INDEPENDENT_AMBULATORY_CARE_PROVIDER_SITE_OTHER): Payer: Medicare Other | Admitting: Student in an Organized Health Care Education/Training Program

## 2016-12-02 ENCOUNTER — Encounter: Payer: Self-pay | Admitting: Student in an Organized Health Care Education/Training Program

## 2016-12-02 ENCOUNTER — Telehealth: Payer: Self-pay

## 2016-12-02 VITALS — Temp 97.6°F

## 2016-12-02 DIAGNOSIS — R601 Generalized edema: Secondary | ICD-10-CM | POA: Diagnosis not present

## 2016-12-02 NOTE — Progress Notes (Signed)
CC: anasarca  HPI: Laurie Murphy is a 83 y.o. female with PMH significant for HTN, DVT, osteoporosis, LE edema presents to [facility] today with generalized swelling of one weeks duration.   History was obtained with assistance of two caretakers in the room as well as daughter over the phone.  Anasarca Patient endorses 1-2 weeks of worsening generalized edema, which has significantly worsened over the past two days. She notes impressive edema of her bilateral hands and legs, and states she is edematous "everywhere." She notes that she has never had swelling this severe in the past. She denies dyspnea, though her caretaker states she may have some dyspnea on exertion. She denies a history of heart failure. She endorses requiring several pillows to sleep at night. She denies chest pain, palpitations, dizziness. No new exposures such as new foods, new medications, or recent vaccinations. No urinary symptoms.  Patient was seen previously in the office for lower extremity edema, however at that time due to lack of dyspnea and recent cardiac echo with normal EF in 05/2016, she was thought follow a dependent edema pattern and was recommended to use compression stockings.   Note at baseline, patient lives at home alone. Completes ADLs. High functioning. 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  Review of Symptoms:  See HPI for ROS.   CC, SH/smoking status, and VS noted.  Objective: Temp 97.6 F (36.4 C) (Oral)  GEN: NAD, alert, cooperative, and pleasant EYE: no conjunctival injection or pallor, EOMI RESPIRATORY: clear to auscultation bilaterally with no wheezes, rhonchi or rales, good effort CV: irregularly irregular, no m/r/g, +impressive bilateral upper extremity edema extends up the arms, +3/5 pitting edema extends above knee bilaterally, +pansystolic murmer GI: soft, non-tender, non-distended, patient endorses abdomen looks swollen SKIN: skin flaking, + 0.5 inch right  greater toe ulcer, +chronic skin changes PSYCH: AAOx3, appropriate affect, thought process linear  Cardiac Echo 05/12/2016 EF 55-60% Mild concentric hypertrophy Moderate to severe aortic stenosis Moderate tricuspid regurg  Assessment and plan:  Anasarca Concern for acute congestive heart failure in the setting of aortic stenosis. Patient's blood pressure has been low over the past several visits. She refuses to allow [place] to check her blood pressure at today's visit due to upper extremity swelling. - Due to lower systolic pressures at recent visits and with the patient being ambulatory, it is not safe to prescribe lasix at this time out of concern for dropping her blood pressure which would result in fall. Patient lives at home by herself. She has caretakers with her in the office today. - She was advised to go to the Emergency department with plans to admit her to the hospital. Patient strongly refuses to go to the hospital. She exhibits medical decision making capacity:  - She endorses understanding that we want her to go to the hospital for cardiac studies (CXR, echo), and treatment (Lasix) with close blood pressure monitoring, which is not safe to do as an outpatient due to  her history of soft blood pressures.  - She endorses understanding of the risks of not going to the hospital, stating that she understands if she goes home her condition will likely worsen and this can even be fatal. She states that she does not want  to go to the hospital because she has things she must to do at home today.  - CXR and Cardiac Echo ordered at today's visit, patient advised to go to hospital to at least get these completed. She  states she is going home. - Spoke with patient's daughter, Laurie Murphy (Phone [phone])Laurie Murphy and expressed my concerns that patient needs to go straight to hospital.  Laurie MarisJaunice will continue to have conversations with the patient to try to convince her to go to the emergency  department. - By my assessment patient has decision making capacity at this time. - Patient signed AMA forms and left the office, she does have a follow up scheduled for Monday - Strict return precautions were advised, patient should go to the ED now, but certainly when swelling worsens or respiratory status worsens she needs to go straight to the emergency department.   Orders Placed This Encounter  Procedures  . DG Chest 2 View    Standing Status:   Future    Standing Expiration Date:   02/01/2018    Order Specific Question:   Reason for Exam (SYMPTOM  OR DIAGNOSIS REQUIRED)    Answer:   anasarca    Order Specific Question:   Preferred imaging location?    [facility]    Order Specific Question:   Radiology Contrast Protocol - do NOT remove file path    Answer:   \\[email or web address].pdf  . ECHOCARDIOGRAM COMPLETE    Standing Status:   Future    Standing Expiration Date:   03/04/2018    Order Specific Question:   Where should this test be performed    Answer:   [place]    Order Specific Question:   Perflutren DEFINITY (image enhancing agent) should be administered unless hypersensitivity or allergy exist    Answer:   Administer Perflutren    Order Specific Question:   Expected Date:    Answer:   ASAP    [name], MD,MS,  PGY2 12/02/2016 5:08 PM

## 2016-12-02 NOTE — Telephone Encounter (Signed)
Patient was called and a message was left on voice mail for her Echocardiogram on Thursday 08 December 2016, at 1300 hrs. She was told to go to admitting and check in 15 min prior to appointment time.Gilberto BetterSimpson, Verlia Kaney R\

## 2016-12-02 NOTE — Patient Instructions (Signed)
IF YOUR SWELLING OR SHORTNESS OF BREATH WORSENS OVER THE WEEKEND, PLEASE GO TO THE EMERGENCY DEPARTMENT.  We would like you to go to the hospital today. It is against medical advice to go home instead of going to the emergency department.  You need a cardiac echo and a chest Xray. These were ordered. We cannot give you the medication to help with fluids because your blood pressure is too low.  PLEASE COME TO YOUR CLINIC APPOINTMENT MONDAY.  Our clinic's number is [phone]. Please call with questions or concerns about what we discussed today.  Be well, Dr. Mosetta PuttFeng

## 2016-12-02 NOTE — Assessment & Plan Note (Addendum)
Concern for acute congestive heart failure in the setting of aortic stenosis. Patient's blood pressure has been low over the past several visits. She refuses to allow us to check her blood pressure at today's visit due to upper extremity swelling. - Due to lower systolic pressures at recent visits and with the patient being ambulatory, it is not safe to prescribe lasix at this time out of concern for dropping her blood pressure which would result in fall. Patient lives at home by herself. She has caretakers with her in the office today. - She was advised to go to the Emergency department with plans to admit her to the hospital. Patient strongly refuses to go to the hospital. She exhibits medical decision making capacity:  - She endorses understanding that we want her to go to the hospital for cardiac studies (CXR, echo), and treatment (Lasix) with close blood pressure monitoring, which is not safe to do as an outpatient due to  her history of soft blood pressures.  - She endorses understanding of the risks of not going to the hospital, stating that she understands if she goes home her condition will likely worsen and this can even be fatal. She states that she does not want  to go to the hospital because she has things she must to do at home today.  - CXR and Cardiac Echo ordered at today's visit, patient advised to go to hospital to at least get these completed. She states she is going home. - Spoke with patient's daughter, Hilda LiasJaunice Jelinek Howard (Phone [phone]) and expressed my concerns that patient needs to go straight to hospital.  Peri MarisJaunice will continue to have conversations with the patient to try to convince her to go to the emergency department. - By my assessment patient has decision making capacity at this time. - Patient signed AMA forms and left the office, she does have a follow up scheduled for Monday - Strict return precautions were advised, patient should go to the ED now, but certainly  when swelling worsens or respiratory status worsens she needs to go straight to the emergency department.

## 2016-12-03 ENCOUNTER — Encounter (HOSPITAL_COMMUNITY): Payer: Self-pay | Admitting: Emergency Medicine

## 2016-12-03 ENCOUNTER — Inpatient Hospital Stay (HOSPITAL_COMMUNITY)
Admission: EM | Admit: 2016-12-03 | Discharge: 2016-12-10 | DRG: 292 | Disposition: A | Discharge status: Home Health | Payer: Medicare Other | Attending: Family Medicine | Admitting: Family Medicine

## 2016-12-03 ENCOUNTER — Emergency Department (HOSPITAL_COMMUNITY): Payer: Medicare Other

## 2016-12-03 DIAGNOSIS — D696 Thrombocytopenia, unspecified: Secondary | ICD-10-CM | POA: Diagnosis not present

## 2016-12-03 DIAGNOSIS — I425 Other restrictive cardiomyopathy: Secondary | ICD-10-CM | POA: Diagnosis present

## 2016-12-03 DIAGNOSIS — J90 Pleural effusion, not elsewhere classified: Secondary | ICD-10-CM

## 2016-12-03 DIAGNOSIS — I110 Hypertensive heart disease with heart failure: Secondary | ICD-10-CM | POA: Diagnosis not present

## 2016-12-03 DIAGNOSIS — Z86718 Personal history of other venous thrombosis and embolism: Secondary | ICD-10-CM

## 2016-12-03 DIAGNOSIS — I2729 Other secondary pulmonary hypertension: Secondary | ICD-10-CM | POA: Diagnosis not present

## 2016-12-03 DIAGNOSIS — R54 Age-related physical debility: Secondary | ICD-10-CM | POA: Diagnosis not present

## 2016-12-03 DIAGNOSIS — I50810 Right heart failure, unspecified: Secondary | ICD-10-CM

## 2016-12-03 DIAGNOSIS — F4323 Adjustment disorder with mixed anxiety and depressed mood: Secondary | ICD-10-CM | POA: Diagnosis present

## 2016-12-03 DIAGNOSIS — E46 Unspecified protein-calorie malnutrition: Secondary | ICD-10-CM | POA: Diagnosis not present

## 2016-12-03 DIAGNOSIS — I872 Venous insufficiency (chronic) (peripheral): Secondary | ICD-10-CM | POA: Diagnosis present

## 2016-12-03 DIAGNOSIS — I350 Nonrheumatic aortic (valve) stenosis: Secondary | ICD-10-CM | POA: Diagnosis present

## 2016-12-03 DIAGNOSIS — R0902 Hypoxemia: Secondary | ICD-10-CM | POA: Diagnosis not present

## 2016-12-03 DIAGNOSIS — N6310 Unspecified lump in the right breast, unspecified quadrant: Secondary | ICD-10-CM | POA: Diagnosis present

## 2016-12-03 DIAGNOSIS — Z9181 History of falling: Secondary | ICD-10-CM

## 2016-12-03 DIAGNOSIS — B353 Tinea pedis: Secondary | ICD-10-CM | POA: Diagnosis present

## 2016-12-03 DIAGNOSIS — I4891 Unspecified atrial fibrillation: Secondary | ICD-10-CM

## 2016-12-03 DIAGNOSIS — I50813 Acute on chronic right heart failure: Secondary | ICD-10-CM | POA: Diagnosis present

## 2016-12-03 DIAGNOSIS — M2550 Pain in unspecified joint: Secondary | ICD-10-CM | POA: Diagnosis not present

## 2016-12-03 DIAGNOSIS — R627 Adult failure to thrive: Secondary | ICD-10-CM | POA: Diagnosis present

## 2016-12-03 DIAGNOSIS — R601 Generalized edema: Secondary | ICD-10-CM | POA: Diagnosis present

## 2016-12-03 DIAGNOSIS — Z8679 Personal history of other diseases of the circulatory system: Secondary | ICD-10-CM

## 2016-12-03 DIAGNOSIS — Z6820 Body mass index (BMI) 20.0-20.9, adult: Secondary | ICD-10-CM | POA: Diagnosis not present

## 2016-12-03 DIAGNOSIS — Z66 Do not resuscitate: Secondary | ICD-10-CM | POA: Diagnosis not present

## 2016-12-03 DIAGNOSIS — E876 Hypokalemia: Secondary | ICD-10-CM | POA: Diagnosis not present

## 2016-12-03 DIAGNOSIS — Z7189 Other specified counseling: Secondary | ICD-10-CM | POA: Diagnosis not present

## 2016-12-03 DIAGNOSIS — Z515 Encounter for palliative care: Secondary | ICD-10-CM | POA: Diagnosis not present

## 2016-12-03 DIAGNOSIS — L97509 Non-pressure chronic ulcer of other part of unspecified foot with unspecified severity: Secondary | ICD-10-CM | POA: Diagnosis present

## 2016-12-03 DIAGNOSIS — Z823 Family history of stroke: Secondary | ICD-10-CM

## 2016-12-03 DIAGNOSIS — R0682 Tachypnea, not elsewhere classified: Secondary | ICD-10-CM | POA: Diagnosis not present

## 2016-12-03 DIAGNOSIS — I4892 Unspecified atrial flutter: Secondary | ICD-10-CM | POA: Diagnosis not present

## 2016-12-03 DIAGNOSIS — R680 Hypothermia, not associated with low environmental temperature: Secondary | ICD-10-CM | POA: Diagnosis present

## 2016-12-03 DIAGNOSIS — R0600 Dyspnea, unspecified: Secondary | ICD-10-CM | POA: Diagnosis not present

## 2016-12-03 LAB — LIPID PANEL
CHOLESTEROL: 165 mg/dL (ref 0–200)
HDL: 55 mg/dL (ref >40)
LDL CALC: 97 mg/dL (ref 0–99)
TRIGLYCERIDES: 63 mg/dL (ref <150)
Total CHOL/HDL Ratio: 3 RATIO
VLDL: 13 mg/dL (ref 0–40)

## 2016-12-03 LAB — CBC
HCT: 35.3 % — ABNORMAL LOW (ref 36.0–46.0)
HEMOGLOBIN: 11.6 g/dL — AB (ref 12.0–15.0)
MCH: 28.6 pg (ref 26.0–34.0)
MCHC: 32.9 g/dL (ref 30.0–36.0)
MCV: 86.9 fL (ref 78.0–100.0)
Platelets: 142 10*3/uL — ABNORMAL LOW (ref 150–400)
RBC: 4.06 MIL/uL (ref 3.87–5.11)
RDW: 17.3 % — ABNORMAL HIGH (ref 11.5–15.5)
WBC: 6.4 10*3/uL (ref 4.0–10.5)

## 2016-12-03 LAB — BASIC METABOLIC PANEL
ANION GAP: 7 (ref 5–15)
BUN: 25 mg/dL — AB (ref 6–20)
CALCIUM: 8.2 mg/dL — AB (ref 8.9–10.3)
CO2: 25 mmol/L (ref 22–32)
Chloride: 112 mmol/L — ABNORMAL HIGH (ref 101–111)
Creatinine, Ser: 0.97 mg/dL (ref 0.44–1.00)
GFR calc Af Amer: 53 mL/min — ABNORMAL LOW (ref >60)
GFR, EST NON AFRICAN AMERICAN: 46 mL/min — AB (ref >60)
GLUCOSE: 110 mg/dL — AB (ref 65–99)
Potassium: 3 mmol/L — ABNORMAL LOW (ref 3.5–5.1)
Sodium: 144 mmol/L (ref 135–145)

## 2016-12-03 LAB — HEPATIC FUNCTION PANEL
ALT: 21 U/L (ref 14–54)
AST: 28 U/L (ref 15–41)
Albumin: 2.4 g/dL — ABNORMAL LOW (ref 3.5–5.0)
Alkaline Phosphatase: 106 U/L (ref 38–126)
BILIRUBIN DIRECT: 0.1 mg/dL (ref 0.1–0.5)
BILIRUBIN INDIRECT: 0.9 mg/dL (ref 0.3–0.9)
Total Bilirubin: 1 mg/dL (ref 0.3–1.2)
Total Protein: 6.9 g/dL (ref 6.5–8.1)

## 2016-12-03 LAB — PREALBUMIN: Prealbumin: 13.6 mg/dL — ABNORMAL LOW (ref 18–38)

## 2016-12-03 LAB — BRAIN NATRIURETIC PEPTIDE: B NATRIURETIC PEPTIDE 5: 301.8 pg/mL — AB (ref 0.0–100.0)

## 2016-12-03 LAB — TROPONIN I: Troponin I: <0.03 ng/mL (ref <0.03)

## 2016-12-03 MED ORDER — ACETAMINOPHEN 325 MG PO TABS: 650.0000 mg | ORAL_TABLET | Freq: Four times a day (QID) | ORAL | Status: DC | PRN

## 2016-12-03 MED ORDER — TERBINAFINE HCL 1 % EX CREA
TOPICAL_CREAM | Freq: Every day | CUTANEOUS | Status: DC
  Administered 2016-12-04 – 2016-12-10 (×6): via TOPICAL
  Filled 2016-12-03: qty 12

## 2016-12-03 MED ORDER — METOPROLOL TARTRATE 12.5 MG HALF TABLET
12.5000 mg | ORAL_TABLET | Freq: Two times a day (BID) | ORAL | Status: DC
  Administered 2016-12-04 – 2016-12-10 (×7): 12.5 mg via ORAL
  Filled 2016-12-03 (×13): qty 1

## 2016-12-03 MED ORDER — FUROSEMIDE 10 MG/ML IJ SOLN
20.0000 mg | Freq: Once | INTRAMUSCULAR | Status: AC
  Administered 2016-12-03: 20 mg via INTRAVENOUS
  Filled 2016-12-03 (×2): qty 2

## 2016-12-03 MED ORDER — POLYVINYL ALCOHOL 1.4 % OP SOLN
1.0000 [drp] | Freq: Three times a day (TID) | OPHTHALMIC | Status: DC | PRN
  Filled 2016-12-03 (×2): qty 15

## 2016-12-03 MED ORDER — POTASSIUM CHLORIDE 20 MEQ/15ML (10%) PO SOLN
40.0000 meq | Freq: Once | ORAL | Status: AC
  Administered 2016-12-03: 40 meq via ORAL
  Filled 2016-12-03: qty 30

## 2016-12-03 MED ORDER — POLYETHYLENE GLYCOL 3350 17 G PO PACK
17.0000 g | PACK | Freq: Every day | ORAL | Status: DC | PRN
  Filled 2016-12-03: qty 1

## 2016-12-03 MED ORDER — FUROSEMIDE 10 MG/ML IJ SOLN
20.0000 mg | Freq: Once | INTRAMUSCULAR | Status: AC
  Administered 2016-12-03: 20 mg via INTRAVENOUS

## 2016-12-03 MED ORDER — LATANOPROST 0.005 % OP SOLN
1.0000 [drp] | Freq: Every day | OPHTHALMIC | Status: DC
  Administered 2016-12-03 – 2016-12-09 (×5): 1 [drp] via OPHTHALMIC
  Filled 2016-12-03: qty 2.5

## 2016-12-03 MED ORDER — ACETAMINOPHEN 650 MG RE SUPP: 650.0000 mg | Freq: Four times a day (QID) | RECTAL | Status: DC | PRN

## 2016-12-03 NOTE — Progress Notes (Signed)
Family Medicine Teaching Service Transfer Acceptance Note  Brief summary of reason for admission/transfer: Anasarca. Vital signs stable. BNP 302. EKG atrial fibrillation. Troponin to be added per our discussion.  Time accepted for transfer: 3:18 PM  ED provider with whom patient discussed: Tatyana Kirichenko. Bed availability confirmed by ED. ED to call [facility].   Please note that patients transferring to [facility] under the [facility] must [facility] at [facility] within two hours of acceptance for transfer.  Almon HerculesGonfa, Taye T, MD PGY-3, Family Medicine Teaching Service Service Pager [phone]

## 2016-12-03 NOTE — ED Notes (Signed)
EDPA Provider at bedside. 

## 2016-12-03 NOTE — ED Triage Notes (Signed)
Per pt, states B/L upper extremity swelling-noticed 3 days ago-no respiratory distress, states she is urinating normally-unable to get an accurate SpO2 at this time

## 2016-12-03 NOTE — ED Notes (Signed)
EDPA Provider at bedside.MADE AWARE OF ADMISSION

## 2016-12-03 NOTE — ED Notes (Signed)
Patient transported to X-ray 

## 2016-12-03 NOTE — ED Provider Notes (Signed)
[facility]-EMERGENCY DEPT Provider Note   CSN: [id number] Arrival date & time: 12/03/16  1129     History   Chief Complaint Chief Complaint  Patient presents with  . Arm Swelling    HPI Laurie Murphy is a 83 y.o. female.  HPI Laurie Murphy is a 83 y.o. female presents to emergency department complaining of swelling.  Patient does not recall when she started to swell, however things this started several days ago.  She was seen yesterday by her family doctor, and was advised to be admitted to the hospital for evaluation for possible congestive heart failure and diuresis in setting of lower blood pressures, however patient refused and left AGAINST MEDICAL ADVICE.  Patient is back today for evaluation.  She denies any chest pain or shortness of breath.  She denies she states that she is urinating and using bathroom normally.  Past Medical History:  Diagnosis Date  . Hypertension   . Shingles     Patient Active Problem List   Diagnosis Date Noted  . Anasarca 12/02/2016  . At high risk for injury related to fall 11/02/2016  . Leg edema 05/25/2016  . Epidural hematoma (HCC) 05/11/2016  . Typical atrial flutter (HCC) 05/11/2016  . Health care maintenance 09/03/2011  . Protein-calorie malnutrition (HCC) 01/03/2011  . Lower leg DVT (deep venous thromboembolism), chronic (HCC) 12/10/2010  . OSTEOPOROSIS 10/25/2007  . HYPERTENSION, BENIGN SYSTEMIC 04/06/2006    History reviewed. No pertinent surgical history.  OB History    No data available       Home Medications    Prior to Admission medications   Medication Sig Start Date End Date Taking? Authorizing Provider  acetaminophen (TYLENOL) 325 MG tablet Take 2 tablets (650 mg total) by mouth every 6 (six) hours as needed for mild pain (or Fever >/= 101). 05/14/16   Jackie Plum, MD  amLODipine (NORVASC) 10 MG tablet Take 1 tablet (10 mg total) by mouth daily. 08/01/16   Ardith Dark, MD    carboxymethylcellulose (REFRESH PLUS) 0.5 % SOLN Place 1 drop into both eyes 3 (three) times daily as needed (for dry eyes).    [provider]  ketoconazole (NIZORAL) 2 % cream Apply 1 application topically daily. Do not apply in between toes 11/03/16   Price, Nolon Bussing, DPM  metoprolol tartrate (LOPRESSOR) 25 MG tablet Take 0.5 tablets (12.5 mg total) by mouth 2 (two) times daily. 05/14/16   Jackie Plum, MD  polyvinyl alcohol (LIQUIFILM TEARS) 1.4 % ophthalmic solution Place 1 drop into both eyes 3 (three) times daily as needed for dry eyes. 05/14/16   Osei-Bonsu, Greggory Stallion, MD  Travoprost, BAK Free, (TRAVATAMN) 0.004 % SOLN ophthalmic solution Place 1 drop into the left eye at bedtime.      [provider]    Family History Family History  Problem Relation Age of Onset  . Stroke Mother     Social History Social History  Substance Use Topics  . Smoking status: Never Smoker  . Smokeless tobacco: Never Used  . Alcohol use No     Allergies   Patient has no known allergies.   Review of Systems Review of Systems  Constitutional: Negative for chills and fever.  Respiratory: Negative for cough, chest tightness and shortness of breath.   Cardiovascular: Positive for leg swelling. Negative for chest pain and palpitations.  Gastrointestinal: Negative for abdominal pain, diarrhea, nausea and vomiting.  Genitourinary: Negative for dysuria, flank pain, pelvic pain, vaginal bleeding,  vaginal discharge and vaginal pain.  Musculoskeletal: Negative for arthralgias, myalgias, neck pain and neck stiffness.  Skin: Negative for rash.  Neurological: Negative for dizziness, weakness and headaches.  All other systems reviewed and are negative.    Physical Exam Updated Vital Signs BP 133/81 (BP Location: Right Arm)   Pulse 85   Temp 97.9 F (36.6 C) (Oral)   Resp 18   SpO2 98%   Physical Exam  Constitutional: She appears well-developed and well-nourished. No distress.   HENT:  Head: Normocephalic.  Eyes: Conjunctivae are normal.  Neck: Neck supple.  Cardiovascular: Normal rate, regular rhythm and normal heart sounds.   Pulmonary/Chest: Effort normal and breath sounds normal. No respiratory distress. She has no wheezes. She has no rales.  Abdominal: Soft. Bowel sounds are normal. She exhibits no distension. There is no tenderness. There is no rebound.  Musculoskeletal: She exhibits edema.  Bilateral upper and lower extremity pitting edema, 3+. Swelling extends from lower legs up to distal abdomen, and mainly hands and forearms in upper extremities. Distal pulses intact  Neurological: She is alert.  Skin: Skin is warm and dry.  Psychiatric: She has a normal mood and affect. Her behavior is normal.  Nursing note and vitals reviewed.    ED Treatments / Results  Labs (all labs ordered are listed, but only abnormal results are displayed) Labs Reviewed  CBC - Abnormal; Notable for the following:       Result Value   Hemoglobin 11.6 (*)    HCT 35.3 (*)    RDW 17.3 (*)    Platelets 142 (*)    All other components within normal limits  BASIC METABOLIC PANEL - Abnormal; Notable for the following:    Potassium 3.0 (*)    Chloride 112 (*)    Glucose, Bld 110 (*)    BUN 25 (*)    Calcium 8.2 (*)    GFR calc non Af Amer 46 (*)    GFR calc Af Amer 53 (*)    All other components within normal limits  HEPATIC FUNCTION PANEL - Abnormal; Notable for the following:    Albumin 2.4 (*)    All other components within normal limits  BRAIN NATRIURETIC PEPTIDE - Abnormal; Notable for the following:    B Natriuretic Peptide 301.8 (*)    All other components within normal limits  TROPONIN I    EKG  EKG Interpretation  Date/Time:  Saturday December 03 2016 13:13:59 EDT Ventricular Rate:  84 PR Interval:    QRS Duration: 88 QT Interval:  471 QTC Calculation: 557 R Axis:   -47 Text Interpretation:  Atrial fibrillation Left anterior fascicular block Low  voltage, precordial leads Abnormal R-wave progression, late transition Nonspecific T abnormalities, lateral leads Prolonged QT interval Confirmed by Lorre NickAllen, Anthony ([id number]) on 12/03/2016 2:05:39 PM       Radiology Dg Chest 2 View  Result Date: 12/03/2016 CLINICAL DATA:  Shortness of breath EXAM: CHEST  2 VIEW COMPARISON:  10/28/2009 FINDINGS: Low volume chest with coarse interstitial opacities. There is pleural based thickening laterally on the right and at the base on the left. Apparent cardiomegaly. Stable mediastinal contours allowing for rotation. IMPRESSION: 1. Chronic lung disease. Interstitial coarsening above prior baseline with small effusions, CHF versus multifocal infection. 2. Pleural thickening along the peripheral right chest that is indeterminate. Recommend radiographic follow up after convalescence. Electronically Signed   By: Marnee SpringJonathon  Watts M.D.   On: 12/03/2016 13:19    Procedures Procedures (including critical care  time)  Medications Ordered in ED Medications  potassium chloride 20 MEQ/15ML (10%) solution 40 mEq (not administered)     Initial Impression / Assessment and Plan / ED Course  I have reviewed the triage vital signs and the nursing notes.  Pertinent labs & imaging results that were available during my care of the patient were reviewed by me and considered in my medical decision making (see chart for details).     Patient was swelling in upper and lower extremities.  Lungs are clear.  Was seen by PCP yesterday, concerning for acute congestive heart failure.  It was noted to have lower blood pressures, normal today.  Will get labs, chest x-ray, most likely admit for anasarca and diuresis.   EKG showing A. fib, history of the same.  Patient is not anticoagulated, most likely due to her age.  Labs as above.  Discussed patient with family practice, they accept her for transfer and admission for diuresis.  Asked to add a troponin to her blood work.  Discussed  admission and plan with patient, initially very hesitant to be admitted, but agrees.  Will transfer to [facility].  Lasix ordered.   Vitals:   12/03/16 1135 12/03/16 1223 12/03/16 1336  BP: (!) 144/73 133/81   Pulse: 77 85   Resp: 17 18   Temp: 98 F (36.7 C) 97.9 F (36.6 C)   TempSrc: Oral Oral   SpO2:  98%   Weight:   60.8 kg (134 lb)    Final Clinical Impressions(s) / ED Diagnoses   Final diagnoses:  Anasarca  Atrial fibrillation, unspecified type [facility])    New Prescriptions New Prescriptions   No medications on file     Jaynie Crumble, PA-C 12/03/16 1546

## 2016-12-03 NOTE — H&P (Signed)
Family Medicine [facility] Admission History and Physical Service Pager: [phone]  Patient name: Laurie Murphy Medical record number: [id number] Date of birth: July 18, 1912 Age: [age] y.o. Gender: female  Primary Care Provider: Renne MuscaWarden, Daniel L, MD Consultants: Nutrition  Code Status: full (patient didn't want to make a decision). She was DNR on previous admission. Will try to clarify this in the morning  Chief Complaint: Swelling all over  Assessment and Plan: Laurie Murphy is a [age] y.o. female presenting with anasarca. PMH is significant for hypertension, DVT, protein calorie malnutrition, epidural hematoma, osteoporosis, atrial flutter, venous insufficiency & fall risk.   Anasarca: patient with some underlying heart failure, pulmonary hypertension, aortic stenosis and protein calorie malnutrition which could all contribute to her anasarca. BNP elevated to 302. CXR with interstitial coarsening concerning for CHF versus multifocal infection. Echocardiogram in 05/2016 showed EF of 55-60%, severe aortic stenosis with a valve area of 0.8 cm, mod to severely dilated LA and PAPP of 65 mmHg. Pre-albumen low at 13.6 today. Other, possibilities are cirrhosis & nephrotic syndrome. She is not that anemic with hemoglobin of 11.6. Her weight is up to 134 pounds today. Baseline appears to be 114.  -Admit to telemetry. Attending Dr. Pollie MeyerMcIntyre -IV Lasix 20 mg once. She is Lasix naive. Redose cautiously -Strict I and O, daily weights -Check lipid panel, UA & urine protein/creatinine ratio to rule out nephrotic syndrome. Albumin and prealbumin low at 2.4 and 13.6 respectively -Check PT/INR for better understanding of her liver synthetic function -Echocardiogram for structural functional assessment of her heart -Continue cycling troponin to rule out ACS -Nutrition consult -Get PT/OT involved early  Atrial flutter/A. Fib without RVR: Not a good candidate for anticoagulation due to her living  situation, history of epidural hematoma and high risk for fall. Rate controlled on metoprolol -Continue home metoprolol -We'll continue monitoring  Aortic stenosis: Aortic valve area 0.8 cm on last echo in 05/2016. And doesn't think she is a good surgical candidate for AVR -We'll touch base with cardiology in the morning  History of hypertension: Appears to be on amlodipine unknown. Normotensive now -Stop amlodipine given edema  Protein calorie malnutrition: Albumin and prealbumin low at 2.4 and 13.6 respectively -Nutrition consult  History of DVT: not  any anticoagulation. Not a good candidate either  Tinea pedis: Bilateral -Terbinafine cream daily  Foot ulcer: She has second degree foot ulcer about 1.5 cm in diameter over her right tarsometatarsal joint. -Dressing per nurse -Wound care consult  Unsafe living condition: She says she lives by herself. She says "someone" gives her a ride when she likes to go shoping. She says she is able to walk with a cane at home. Daughter lives in [place] -Social work consult in the morning -Touch base with her daughter in the morning. She says it is okay to talk to her daughter but she does want her daughter to make a decision for her.  Code status: Full code for now. Patient doesn't want to make decision. Previously DNR -Clarify in the morning -Consider palliative consults  FEN/GI: -Heart healthy diet  Prophylaxis: -Lovenox  Disposition: Admit to telemetry for evaluation and management of anasarca  History of Present Illness:  Laurie Murphy is a [age] y.o. female presenting with anasarca.  Patient was seen in clinic yesterday and found to have anasarca and low blood pressure. At that time, there was a concern about sending her home on oral Lasix given her low blood pressure. So she was advised to  go to ED for admission. However, patient refused and signed AMA and went home.  Today, she presented to [facility] ED for generalized swelling for  2 days. She has no other symptoms. She denies shortness of breath, chest pain, orthopnea, fever, chills, headache, vision changes, nausea, vomiting, diarrhea and dysuria. She denies new changes to her medication. She says she never had such swelling before. She had echocardiogram in 05/2016 which showed EF of 55-60%, severe aortic stenosis with a valve area of 0.8 cm, mod to severely dilated LA and PAPP of 65 mmHg.  Patient lives by herself. She says she gets her food from [employer]. She says someone drives her there. She reports watching his salt intake. She denies smoking cigarettes, drinking alcohol or recent illness  ED course: Vital signs within normal limits. Oxygen saturation in upper 90s on room air. BMP basically normal except for hypokalemia to 3.0. BNP 302. Albumin 2.4. Prealbumin 13.6. Troponin negative 2. CBC with hemoglobin to 11.6 and thrombocytopenia to 142 otherwise normal. EKG with atrial fibrillation without RVR. CXR with interstitial coarsening concerning for CHF versus multifocal infection. Family medicine was called to admit patient for anasarca.   Review of Systems  Constitutional: Negative for fever and weight loss.  HENT: Negative for sore throat.   Eyes: Negative for blurred vision, photophobia and pain.  Respiratory: Negative for cough.   Cardiovascular: Positive for leg swelling. Negative for chest pain and palpitations.  Gastrointestinal: Negative for abdominal pain, blood in stool, diarrhea, melena and vomiting.  Genitourinary: Negative for dysuria.  Musculoskeletal: Negative for myalgias.  Skin: Negative for rash.  Neurological: Negative for speech change, focal weakness, weakness and headaches.  Endo/Heme/Allergies: Does not bruise/bleed easily.  Psychiatric/Behavioral: Negative for substance abuse.    Patient Active Problem List   Diagnosis Date Noted  . Anasarca 12/02/2016  . At high risk for injury related to fall 11/02/2016  . Leg edema 05/25/2016  .  Epidural hematoma (HCC) 05/11/2016  . Typical atrial flutter (HCC) 05/11/2016  . Health care maintenance 09/03/2011  . Protein-calorie malnutrition (HCC) 01/03/2011  . Lower leg DVT (deep venous thromboembolism), chronic (HCC) 12/10/2010  . OSTEOPOROSIS 10/25/2007  . HYPERTENSION, BENIGN SYSTEMIC 04/06/2006    Past Medical History: Past Medical History:  Diagnosis Date  . Hypertension   . Shingles     Past Surgical History: History reviewed. No pertinent surgical history.  Social History: Social History  Substance Use Topics  . Smoking status: Never Smoker  . Smokeless tobacco: Never Used  . Alcohol use No   Additional social history:   Please also refer to relevant sections of EMR.  Family History: Family History  Problem Relation Age of Onset  . Stroke Mother    (If not completed, MUST add something in)  Allergies and Medications: No Known Allergies No current facility-administered medications on file prior to encounter.    Current Outpatient Prescriptions on File Prior to Encounter  Medication Sig Dispense Refill  . acetaminophen (TYLENOL) 325 MG tablet Take 2 tablets (650 mg total) by mouth every 6 (six) hours as needed for mild pain (or Fever >/= 101). 30 tablet 0  . amLODipine (NORVASC) 10 MG tablet Take 1 tablet (10 mg total) by mouth daily. 30 tablet 5  . carboxymethylcellulose (REFRESH PLUS) 0.5 % SOLN Place 1 drop into the left eye 3 (three) times daily as needed (for dry eyes).     Marland Kitchen ketoconazole (NIZORAL) 2 % cream Apply 1 application topically daily. Do not apply  in between toes 15 g 0  . metoprolol tartrate (LOPRESSOR) 25 MG tablet Take 0.5 tablets (12.5 mg total) by mouth 2 (two) times daily. 30 tablet 0  . polyvinyl alcohol (LIQUIFILM TEARS) 1.4 % ophthalmic solution Place 1 drop into both eyes 3 (three) times daily as needed for dry eyes. 15 mL 0  . Travoprost, BAK Free, (TRAVATAMN) 0.004 % SOLN ophthalmic solution Place 1 drop into the left eye at  bedtime.        Objective: BP 125/83 (BP Location: Left Arm)   Pulse 100   Temp 97.8 F (36.6 C) (Oral)   Resp 20   Wt 134 lb (60.8 kg)   SpO2 98%   BMI 23.00 kg/m  Exam: GEN: appears frail, in no acute distress Head: normocephalic and atraumatic  Eyes: conjunctiva without injection, sclera anicteric HEM: negative for cervical or periauricular lymphadenopathies CVS: Irregularly irregular, nl s1 & s2, 2-3/6 SEM over RUSB and LUSB, 2+ edema in her legs, hips and arms, very faint DP and PT pulses bilaterally RESP: no IWOB, good air movement bilaterally, CTAB anteriorly GI: BS present & normal, soft, NTND. No sign of overt ascites. No mass GU: no suprapubic tenderness MSK: no focal tenderness or notable swelling SKIN: Circular second-degree ulcer over her right first tarsometatarsal joint and about 1.5 cm in diameter, no surrounding cellulitic picture, tenia pedis bilaterally NEURO: alert and oiented appropriately, no gross deficits   Labs and Imaging: CBC BMET   Recent Labs Lab 12/03/16 1151  WBC 6.4  HGB 11.6*  HCT 35.3*  PLT 142*    Recent Labs Lab 12/03/16 1151  NA 144  K 3.0*  CL 112*  CO2 25  BUN 25*  CREATININE 0.97  GLUCOSE 110*  CALCIUM 8.2*     Dg Chest 2 View  Result Date: 12/03/2016 CLINICAL DATA:  Shortness of breath EXAM: CHEST  2 VIEW COMPARISON:  10/28/2009 FINDINGS: Low volume chest with coarse interstitial opacities. There is pleural based thickening laterally on the right and at the base on the left. Apparent cardiomegaly. Stable mediastinal contours allowing for rotation. IMPRESSION: 1. Chronic lung disease. Interstitial coarsening above prior baseline with small effusions, CHF versus multifocal infection. 2. Pleural thickening along the peripheral right chest that is indeterminate. Recommend radiographic follow up after convalescence. Electronically Signed   By: Marnee Spring M.D.   On: 12/03/2016 13:19    [name], MD 12/03/2016, 6:17  PM PGY-3, [facility] FPTS Intern pager: [phone], text pages welcome

## 2016-12-03 NOTE — ED Notes (Signed)
PT LEFT WITH CAREL[facility]. MC RECEIVING-- SHAFACINAHAN F RN AWARE LASIX PENDING.

## 2016-12-03 NOTE — ED Provider Notes (Signed)
Medical screening examination/treatment/procedure(s) were conducted as a shared visit with non-physician practitioner(s) and myself.  I personally evaluated the patient during the encounter.   EKG Interpretation  Date/Time:  Saturday December 03 2016 13:13:59 EDT Ventricular Rate:  84 PR Interval:    QRS Duration: 88 QT Interval:  471 QTC Calculation: 557 R Axis:   -47 Text Interpretation:  Atrial fibrillation Left anterior fascicular block Low voltage, precordial leads Abnormal R-wave progression, late transition Nonspecific T abnormalities, lateral leads Prolonged QT interval Confirmed by Lorre NickAllen, Dao Memmott ([id number]) on 12/03/2016 2:05:2239 PM     83 year old female presents with several days of diffuse body swelling.  Checks x-ray consistent with CHF and BNP is elevated as well 2.  Will admit for diuresis   Lorre NickAllen, Arieonna Medine, MD 12/03/16 1406

## 2016-12-03 NOTE — Progress Notes (Signed)
NURSING PROGRESS NOTE  Gregary SignsGladys F Schermer [id number] Admission Data: 12/03/2016 8:56 PM Attending Provider: Latrelle DodrillMcIntyre, Brittany J, MD WUJ:[id number]:Warden, Rande Bruntaniel L, MD Code Status: full  Allergies:  Patient has no known allergies. Past Medical History:   has a past medical history of Hypertension and Shingles. Past Surgical History:   has no past surgical history on file. Social History:   reports that she has never smoked. She has never used smokeless tobacco. She reports that she does not drink alcohol or use drugs.  Gregary SignsGladys F Cristiano is a [age] y.o. female patient admitted from ED:   Last Documented Vital Signs: Blood pressure 130/89, pulse (!) 47, temperature (!) 97.4 F (36.3 C), temperature source Oral, resp. rate 20, height 5\' 5"  (1.651 m), weight 61 kg (134 lb 6.4 oz), SpO2 93 %.  Cardiac Monitoring: Box # no in place. Cardiac monitor yields:normal sinus rhythm.  IV Fluids:  IV in place, occlusive dsg intact without redness, IV cath hand left, condition patent and no redness normal saline.   Skin: small wound both big toes and small bruises on rt leg  Patient/Family orientated to room. Information packet given to patient/family. Admission inpatient armband information verified with patient/family to include name and date of birth and placed on patient arm. Side rails up x 2, fall assessment and education completed with patient/family. Patient/family able to verbalize understanding of risk associated with falls and verbalized understanding to call for assistance before getting out of bed. Call light within reach. Patient/family able to voice and demonstrate understanding of unit orientation instructions.    Will continue to evaluate and treat per MD orders.   Monico BlitzJameela RN

## 2016-12-03 NOTE — ED Notes (Signed)
Call report to April 16:05, [phone]

## 2016-12-03 NOTE — ED Notes (Signed)
PT EATING SANDWICH. PT DECLINES LASIX IVP. CARELINK PRESENT TO RECEIVE PT.

## 2016-12-03 NOTE — ED Notes (Signed)
ED TO INPATIENT HANDOFF REPORT  Name/Age/Gender Laurie Murphy 83 y.o. female  Code Status Code Status History    Date Active Date Inactive Code Status Order ID Comments User Context   05/11/2016  4:16 PM 05/14/2016  6:57 PM DNR [id number]  Debbe Odea, MD ED    Questions for Most Recent Historical Code Status (Order [id number])    Question Answer Comment   In the event of cardiac or respiratory ARREST Do not call a "code blue"    In the event of cardiac or respiratory ARREST Do not perform Intubation, CPR, defibrillation or ACLS    In the event of cardiac or respiratory ARREST Use medication by any route, position, wound care, and other measures to relive pain and suffering. May use oxygen, suction and manual treatment of airway obstruction as needed for comfort.       Home/SNF/Othe  Chief Complaint swelling in body  Level of Care/Admitting Diagnosis ED Disposition    ED Disposition Condition [facility] Area: [place] [100100]  Level of Care: Telemetry [5]  Diagnosis: Anasarca [673419]  Admitting Physician: Mercy Riding [[id number]]  Attending Physician: Leeanne Rio [phone]  Estimated length of stay: past midnight tomorrow  Certification:: I certify this patient will need inpatient services for at least 2 midnights  PT Class (Do Not Modify): Inpatient [101]  PT Acc Code (Do Not Modify): Private [1]       Medical History Past Medical History:  Diagnosis Date  . Hypertension   . Shingles     Allergies No Known Allergies  IV Location/Drains/Wounds Patient Lines/Drains/Airways Status   Active Line/Drains/Airways    Name:   Placement date:   Placement time:   Site:   Days:   Peripheral IV 12/03/16 Left Antecubital  12/03/16    1607    Antecubital    less than 1   Wound / Incision (Open or Dehisced) 05/13/16 Laceration Head Posterior  05/13/16    2130    Head    204          Labs/Imaging Results for orders placed or  performed during the hospital encounter of 12/03/16 (from the past 48 hour(s))  CBC     Status: Abnormal   Collection Time: 12/03/16 11:51 AM  Result Value Ref Range   WBC 6.4 4.0 - 10.5 K/uL   RBC 4.06 3.87 - 5.11 MIL/uL   Hemoglobin 11.6 (L) 12.0 - 15.0 g/dL   HCT 35.3 (L) 36.0 - 46.0 %   MCV 86.9 78.0 - 100.0 fL   MCH 28.6 26.0 - 34.0 pg   MCHC 32.9 30.0 - 36.0 g/dL   RDW 17.3 (H) 11.5 - 15.5 %   Platelets 142 (L) 150 - 400 K/uL  Basic metabolic panel     Status: Abnormal   Collection Time: 12/03/16 11:51 AM  Result Value Ref Range   Sodium 144 135 - 145 mmol/L   Potassium 3.0 (L) 3.5 - 5.1 mmol/L   Chloride 112 (H) 101 - 111 mmol/L   CO2 25 22 - 32 mmol/L   Glucose, Bld 110 (H) 65 - 99 mg/dL   BUN 25 (H) 6 - 20 mg/dL   Creatinine, Ser 0.97 0.44 - 1.00 mg/dL   Calcium 8.2 (L) 8.9 - 10.3 mg/dL   GFR calc non Af Amer 46 (L) >60 mL/min   GFR calc Af Amer 53 (L) >60 mL/min    Comment: (NOTE) The eGFR has been calculated  using the CKD EPI equation. This calculation has not been validated in all clinical situations. eGFR's persistently <60 mL/min signify possible Chronic Kidney Disease.    Anion gap 7 5 - 15  Hepatic function panel     Status: Abnormal   Collection Time: 12/03/16 11:51 AM  Result Value Ref Range   Total Protein 6.9 6.5 - 8.1 g/dL   Albumin 2.4 (L) 3.5 - 5.0 g/dL   AST 28 15 - 41 U/L   ALT 21 14 - 54 U/L   Alkaline Phosphatase 106 38 - 126 U/L   Total Bilirubin 1.0 0.3 - 1.2 mg/dL   Bilirubin, Direct 0.1 0.1 - 0.5 mg/dL   Indirect Bilirubin 0.9 0.3 - 0.9 mg/dL  Brain natriuretic peptide     Status: Abnormal   Collection Time: 12/03/16 11:51 AM  Result Value Ref Range   B Natriuretic Peptide 301.8 (H) 0.0 - 100.0 pg/mL  Troponin I     Status: None   Collection Time: 12/03/16  3:45 PM  Result Value Ref Range   Troponin I <0.03 <0.03 ng/mL   Dg Chest 2 View  Result Date: 12/03/2016 CLINICAL DATA:  Shortness of breath EXAM: CHEST  2 VIEW COMPARISON:   10/28/2009 FINDINGS: Low volume chest with coarse interstitial opacities. There is pleural based thickening laterally on the right and at the base on the left. Apparent cardiomegaly. Stable mediastinal contours allowing for rotation. IMPRESSION: 1. Chronic lung disease. Interstitial coarsening above prior baseline with small effusions, CHF versus multifocal infection. 2. Pleural thickening along the peripheral right chest that is indeterminate. Recommend radiographic follow up after convalescence. Electronically Signed   By: Monte Fantasia M.D.   On: 12/03/2016 13:19    Pending Labs Unresulted Labs    None      Vitals/Pain Today's Vitals   12/03/16 1336 12/03/16 1530 12/03/16 1608 12/03/16 1622  BP:  (!) 146/72  125/83  Pulse:    100  Resp:  (!) 23  20  Temp:    97.8 F (36.6 C)  TempSrc:    Oral  SpO2:    98%  Weight: 134 lb (60.8 kg)     PainSc:   0-No pain     Isolation Precautions No active isolations  Medications Medications  furosemide (LASIX) injection 20 mg (not administered)  potassium chloride 20 MEQ/15ML (10%) solution 40 mEq (40 mEq Oral Given 12/03/16 1425)    Mobility walks with device

## 2016-12-04 ENCOUNTER — Inpatient Hospital Stay (HOSPITAL_COMMUNITY): Payer: Medicare Other

## 2016-12-04 ENCOUNTER — Other Ambulatory Visit (HOSPITAL_COMMUNITY): Payer: Medicare Other

## 2016-12-04 DIAGNOSIS — N6310 Unspecified lump in the right breast, unspecified quadrant: Secondary | ICD-10-CM

## 2016-12-04 DIAGNOSIS — R0600 Dyspnea, unspecified: Secondary | ICD-10-CM

## 2016-12-04 DIAGNOSIS — R601 Generalized edema: Secondary | ICD-10-CM

## 2016-12-04 LAB — ECHOCARDIOGRAM COMPLETE
AV Area VTI index: 0.45 cm2/m2
AV Area mean vel: 0.89 cm2
AV Mean grad: 10 mmHg
AV Peak grad: 23 mmHg
AV area mean vel ind: 0.53 cm2/m2
AV peak Index: 0.45
AVA: 0.76 cm2
AVAREAVTI: 0.75 cm2
AVCELMEANRAT: 0.31
AVPKVEL: 241 cm/s
Ao pk vel: 0.26 m/s
CHL CUP AV VALUE AREA INDEX: 0.45
CHL CUP AV VEL: 0.76
CHL CUP RV SYS PRESS: 69 mmHg
DOP CAL AO MEAN VELOCITY: 137 cm/s
EWDT: 165 ms
FS: 41 % (ref 28–44)
Height: 65 in
IV/PV OW: 1.21
LA ID, A-P, ES: 34 mm
LA diam end sys: 34 mm
LA vol A4C: 59.6 ml
LADIAMINDEX: 2.03 cm/m2
LAVOL: 72.8 mL
LAVOLIN: 43.5 mL/m2
LDCA: 2.84 cm2
LV PW d: 11.1 mm — AB (ref 0.6–1.1)
LV SIMPSON'S DISK: 70
LV dias vol index: 21 mL/m2
LV dias vol: 36 mL — AB (ref 46–106)
LV sys vol: 11 mL — AB
LVOT SV: 31 mL
LVOT VTI: 11 cm
LVOT diameter: 19 mm
LVOTPV: 63.3 cm/s
LVOTVTI: 0.27 cm
LVSYSVOLIN: 6 mL/m2
MV Dec: 165
MV Peak grad: 7 mmHg
MVPKAVEL: 49.8 m/s
MVPKEVEL: 134 m/s
Reg peak vel: 391 cm/s
Stroke v: 25 ml
TAPSE: 9.31 mm
TR max vel: 391 cm/s
VTI: 40.9 cm
Weight: 2150.4 oz

## 2016-12-04 LAB — COMPREHENSIVE METABOLIC PANEL
ALK PHOS: 109 U/L (ref 38–126)
ALT: 23 U/L (ref 14–54)
ANION GAP: 8 (ref 5–15)
AST: 30 U/L (ref 15–41)
Albumin: 2.4 g/dL — ABNORMAL LOW (ref 3.5–5.0)
BILIRUBIN TOTAL: 0.6 mg/dL (ref 0.3–1.2)
BUN: 21 mg/dL — ABNORMAL HIGH (ref 6–20)
CALCIUM: 8.2 mg/dL — AB (ref 8.9–10.3)
CO2: 25 mmol/L (ref 22–32)
CREATININE: 1.01 mg/dL — AB (ref 0.44–1.00)
Chloride: 109 mmol/L (ref 101–111)
GFR calc non Af Amer: 44 mL/min — ABNORMAL LOW (ref >60)
GFR, EST AFRICAN AMERICAN: 50 mL/min — AB (ref >60)
Glucose, Bld: 106 mg/dL — ABNORMAL HIGH (ref 65–99)
Potassium: 3.2 mmol/L — ABNORMAL LOW (ref 3.5–5.1)
Sodium: 142 mmol/L (ref 135–145)
TOTAL PROTEIN: 6.8 g/dL (ref 6.5–8.1)

## 2016-12-04 LAB — CBC
HCT: 36.5 % (ref 36.0–46.0)
HEMOGLOBIN: 11.8 g/dL — AB (ref 12.0–15.0)
MCH: 28.1 pg (ref 26.0–34.0)
MCHC: 32.3 g/dL (ref 30.0–36.0)
MCV: 86.9 fL (ref 78.0–100.0)
PLATELETS: 116 10*3/uL — AB (ref 150–400)
RBC: 4.2 MIL/uL (ref 3.87–5.11)
RDW: 17.3 % — ABNORMAL HIGH (ref 11.5–15.5)
WBC: 5.8 10*3/uL (ref 4.0–10.5)

## 2016-12-04 LAB — PROTIME-INR
INR: 1.15
PROTHROMBIN TIME: 14.6 s (ref 11.4–15.2)

## 2016-12-04 LAB — TROPONIN I: Troponin I: <0.03 ng/mL (ref <0.03)

## 2016-12-04 MED ORDER — POTASSIUM CHLORIDE CRYS ER 20 MEQ PO TBCR
40.0000 meq | EXTENDED_RELEASE_TABLET | ORAL | Status: AC
  Administered 2016-12-04 (×2): 40 meq via ORAL
  Filled 2016-12-04 (×2): qty 2

## 2016-12-04 NOTE — Progress Notes (Signed)
*  PRELIMINARY RESULTS* Echocardiogram 2D Echocardiogram has been performed.  Stacey DrainWhite, Laray Rivkin J 12/04/2016, 3:44 PM

## 2016-12-04 NOTE — Progress Notes (Signed)
Family Medicine Teaching Service Daily Progress Note Intern Pager: [phone]  Patient name: Laurie SignsGladys F Murphy Medical record number: [id number] Date of birth: [birth year] Age: [age] y.o. Gender: female  Primary Care Provider: Renne MuscaWarden, Daniel L, MD Consultants:  Code Status: full (patient was undecided)  Pt Overview and Major Events to Date:  Laurie Murphy is a [age] y.o. female presenting with anasarca. PMH is significant for hypertension, DVT, protein calorie malnutrition, epidural hematoma, osteoporosis, atrial flutter, venous insufficiency & fall risk.   Assessment and Plan: Laurie Murphy is a [age] y.o. female presenting with anasarca. PMH is significant for hypertension, DVT, protein calorie malnutrition, epidural hematoma, osteoporosis, atrial flutter, venous insufficiency & fall risk.   Anasarca: patient with some underlying heart failure, pulmonary hypertension, aortic stenosis and protein calorie malnutrition which could all contribute to her anasarca. BNP elevated to 302. CXR with interstitial coarsening concerning for CHF versus multifocal infection. Echocardiogram in 05/2016 showed EF of 55-60%, severe aortic stenosis with a valve area of 0.8 cm, mod to severely dilated LA and PAPP of 65 mmHg. Pre-albumen low at 13.6 today. Other, possibilities are cirrhosis & nephrotic syndrome. She is not that anemic with hemoglobin of 11.6. Her weight is up to 134 pounds today. Baseline appears to be 114. PT/INR and lipid panel WNL.  Albumin and prealbumin low at 2.4 and 13.6 respectively -Admit to telemetry. Attending Dr. Pollie MeyerMcIntyre -IV Lasix 20 mg once. She is Lasix naive. Redose cautiously -Strict I and O, daily weights -UA & urine protein/creatinine ratio to rule out nephrotic syndrome.  -Echocardiogram pending for structural functional assessment of her heart -Continue cycling troponin to rule out ACS (neg so far) -Nutrition consult ordered -PT/OT eval treat ordered  Atrial flutter/A. Fib without  RVR: Not a good candidate for anticoagulation due to her living situation, history of epidural hematoma and high risk for fall. Rate controlled on metoprolol -Continue home metoprolol -We'll continue monitoring  Aortic stenosis: Aortic valve area 0.8 cm on last echo in 05/2016. And doesn't think she is a good surgical candidate for AVR -Will consider touching base with cardiology in the morning  History of hypertension: Appears to be on amlodipine unknown. Normotensive now -Stop amlodipine given edema  Protein calorie malnutrition: Albumin and prealbumin low at 2.4 and 13.6 respectively -Nutrition consult  History of DVT: not  any anticoagulation. Not a good candidate either  Tinea pedis: Bilateral -Terbinafine cream daily  Foot ulcer: She has second degree foot ulcer about 1.5 cm in diameter over her right tarsometatarsal joint. -Dressing per nurse -Wound care   Unsafe living condition: She says she lives by herself. She says "someone" gives her a ride when she likes to go shoping. She says she is able to walk with a cane at home. Daughter lives in [place] -Social work consult in the morning -Touch base with her daughter in the morning. She says it is okay to talk to her daughter but she does want her daughter to make a decision for her.  Code status: Full code for now. Patient doesn't want to make decision. Previously DNR -Clarify in the morning -Consider palliative consults  FEN/GI: -Heart healthy diet  Prophylaxis: -Lovenox  Disposition: Admit to telemetry for evaluation and management of anasarca  Subjective:  Patient was AOx3 and stated she had no pain.   No guess as to what the cause of her swelling is, not complaining of SOB/dysuria/diarrhea/NV etc.  Was still not sure she wanted to be full code but wasn't ready to  decide her DNR status.  She will think about it.   Is consistent with admission interview in saying she is not interested in  placement  Objective: Temp:  [97.4 F (36.3 C)-98 F (36.7 C)] 97.5 F (36.4 C) (10/28 0504) Pulse Rate:  [47-100] 85 (10/28 0504) Resp:  [17-23] 18 (10/28 0504) BP: (118-146)/(72-89) 143/73 (10/28 0504) SpO2:  [81 %-98 %] 91 % (10/28 0504) Weight:  [134 lb (60.8 kg)-134 lb 6.4 oz (61 kg)] 134 lb 6.4 oz (61 kg) (10/27 1846) Physical Exam: GEN: appears frail, in no acute distress Head: normocephalic and atraumatic  Eyes: conjunctiva without injection, sclera anicteric HEM: negative for cervical or periauricular lymphadenopathies CVS: Irregularly irregular, nl s1 & s2, systolic murmur, 1+ edema in her legs, 2+ in hips and arms, very faint DP in LE/UE RESP: no IWOB, good air movement bilaterally, CTAB anteriorly GI: BS present & normal, soft, NTND. No sign of overt ascites. No mass GU: no suprapubic tenderness MSK: no focal tenderness or notable swelling SKIN: Circular second-degree ulcer over her right first tarsometatarsal joint and about 1.5 cm in diameter, no surrounding cellulitic picture, tenia pedis bilaterally NEURO: alert and oiented appropriately, no gross deficits   Laboratory:  Recent Labs Lab 12/03/16 1151 12/04/16 0432  WBC 6.4 5.8  HGB 11.6* 11.8*  HCT 35.3* 36.5  PLT 142* 116*    Recent Labs Lab 12/03/16 1151 12/04/16 0432  NA 144 142  K 3.0* 3.2*  CL 112* 109  CO2 25 25  BUN 25* 21*  CREATININE 0.97 1.01*  CALCIUM 8.2* 8.2*  PROT 6.9 6.8  BILITOT 1.0 0.6  ALKPHOS 106 109  ALT 21 23  AST 28 30  GLUCOSE 110* 106*    Troponin <0.03 x3  Imaging/Diagnostic Tests: Dg Chest 2 View  Result Date: 12/03/2016 CLINICAL DATA:  Shortness of breath EXAM: CHEST  2 VIEW COMPARISON:  10/28/2009 FINDINGS: Low volume chest with coarse interstitial opacities. There is pleural based thickening laterally on the right and at the base on the left. Apparent cardiomegaly. Stable mediastinal contours allowing for rotation. IMPRESSION: 1. Chronic lung disease.  Interstitial coarsening above prior baseline with small effusions, CHF versus multifocal infection. 2. Pleural thickening along the peripheral right chest that is indeterminate. Recommend radiographic follow up after convalescence. Electronically Signed   By: Marnee Spring M.D.   On: 12/03/2016 13:19     Marthenia Rolling, DO 12/04/2016, 7:26 AM PGY-1, [facility] FPTS Intern pager: [phone], text pages welcome

## 2016-12-04 NOTE — Progress Notes (Signed)
Patient is refused to give urine sample for analysis .

## 2016-12-04 NOTE — Discharge Summary (Signed)
Family Medicine Teaching [facility] Discharge Summary  Patient name: Laurie Murphy Medical record number: [id number] Date of birth: 06-Sep-1912 Age: 83 y.o. Gender: female Date of Admission: 12/03/2016  Date of Discharge: 12/09/16 Admitting Physician: Latrelle Dodrill, MD  Primary Care Provider: Renne Musca, MD Consultants: cardiology, pulmonology CCM  Indication for Hospitalization: edema/ SOB  Discharge Diagnoses/Problem List:  Aortic stenosis Restrictive cardiomyopathy Enlarged atria Bilateral effusion Pulmonary hypertension R breast mass palpable on exam and confirmed by CT anasarca Protein calorie malnutrition Stage 2 ulcer on R 1st mtp joint A.fib/flutter w/o RVR HTN Tinea pedis Hx of DVT Venous insufficiency  Disposition: to home with home health as patient refused SNF placement  Discharge Condition: stable  Discharge Exam: performed by attending on day of discharge Patient is slumped over in bed with eyes closed.  Breathing is normal.  Heart rate is around 100-110 and irregular.  Lungs with coarse breath sounds bases bilaterally.  Abdomen is nontender.  She does have +1-+2 pitting edema in the dependent areas of her extremities both elbows and hips.  Dark urine noted in Foley collection container.  Brief Hospital Course: (Patient prefers to be addressed as Dr. Cliffton Asters, she was in education) Patient presented for admission after c/o 2wks/99month of new anasarca and SOB. Prior to this change in health she had been ambulatory with a cane and living independently.   CXR showed bilateral effusions with potential R pleural thickening and echo showed aortic stenosis with HFpEF 60-65% restrictive cardiomyopathy. Cardiology and pulmonolgy were consulted and determined she was not a procedural candidate (suggested palliative).  CCM suggested 1xday of albumin replacement as 4x lasix 40mg  which produced significant diuresis and improvement in SOB with slight improvement in  anasarca.  R breat mass was noted on exam and CT confirmed mass.  Patient and family declined heme/onc consult as she would decline any chemo/radiation/surgery.  Concern for maintaining preload due to aortic stenosis caused [place] to hold further diuresis and patient became tachypnic so we increased metoprolol to 25mg  BID.  Patient and family are insistent on home health and no placement.  They ask when patient would be medically stable enough to fly to [place] and I have advised them that I cannot predict that but she would need to have significant improvement before that might be considered.  Daughter lives in [place], is a Clinical research associate, and has been heavily involved in decision making.  This has been a dramatic decline in status and has been emotional for family.  Issues for Follow Up:  1. Patient is non-procedural candidate per cardiology/pulmonology while inpatient, this has been explained to patient and daughter.  They are aware that we are restricted to medicinal management and attempting to minimize symptoms. 2. Patient has repeatedly declined oncology referral for mass in R breast. 3. Please recheck weight and BMP for potassium 4. *As you consider diuresis/BP control, be advised patient has aortic stenosis and requires adequate preload.  She did respond well to 4x40mg  doses of lasix but required potassium supplementation, neither of those were prescribed because we had lowered her to dry weight ~116-120 and wanted her to be re-evaluated at close clinic followup for further diuresis.  Suggest establishing a weight limit at which point she would take lasix/kdur.  Patient was still intravascularly depleted with significant edema (albumin 2.4) at 116-119, we considered malignant effusion/cardiac/protein calorie malnutrition as causes of edema. 5. Patient was discharged with increased metoprolol 25mg  BID out of concern for HR, please evaluate for effectiveness.  Significant  Procedures:   Significant Labs and  Imaging:   Recent Labs Lab 12/04/16 0432 12/06/16 0624 12/07/16 0658  WBC 5.8 11.0* 10.4  HGB 11.8* 13.3 13.2  HCT 36.5 40.5 39.9  PLT 116* 137* 102*    Recent Labs Lab 12/04/16 0432 12/06/16 0624 12/07/16 0658 12/08/16 0708 12/09/16 0650  NA 142 145 143 144 143  K 3.2* 3.8 3.4* 3.0* 3.4*  CL 109 111 107 106 109  CO2 25 23 25 28 27   GLUCOSE 106* 106* 96 85 104*  BUN 21* 25* 30* 29* 28*  CREATININE 1.01* 1.09* 1.10* 0.98 0.87  CALCIUM 8.2* 8.5* 8.4* 8.2* 8.1*  ALKPHOS 109  --   --   --   --   AST 30  --   --   --   --   ALT 23  --   --   --   --   ALBUMIN 2.4*  --   --   --   --     Dg Chest 2 View  Result Date: 12/03/2016 CLINICAL DATA:  Shortness of breath EXAM: CHEST  2 VIEW COMPARISON:  10/28/2009 FINDINGS: Low volume chest with coarse interstitial opacities. There is pleural based thickening laterally on the right and at the base on the left. Apparent cardiomegaly. Stable mediastinal contours allowing for rotation. IMPRESSION: 1. Chronic lung disease. Interstitial coarsening above prior baseline with small effusions, CHF versus multifocal infection. 2. Pleural thickening along the peripheral right chest that is indeterminate. Recommend radiographic follow up after convalescence. Electronically Signed   By: Marnee SpringJonathon  Watts M.D.   On: 12/03/2016 13:19    Results/Tests Pending at Time of Discharge:   Discharge Medications:  Allergies as of 12/10/2016   No Known Allergies     Medication List    STOP taking these medications   amLODipine 10 MG tablet Commonly known as:  NORVASC     TAKE these medications   acetaminophen 325 MG tablet Commonly known as:  TYLENOL Take 2 tablets (650 mg total) by mouth every 6 (six) hours as needed for mild pain (or Fever >/= 101).   carboxymethylcellulose 0.5 % Soln Commonly known as:  REFRESH PLUS Place 1 drop into the left eye 3 (three) times daily as needed (for dry eyes).   ketoconazole 2 % cream Commonly known as:   NIZORAL Apply 1 application topically daily. Do not apply in between toes   metoprolol tartrate 25 MG tablet Commonly known as:  LOPRESSOR Take 0.5 tablets (12.5 mg total) by mouth 2 (two) times daily. What changed:  Another medication with the same name was added. Make sure you understand how and when to take each.   metoprolol tartrate 25 MG tablet Commonly known as:  LOPRESSOR Take 1 tablet (25 mg total) by mouth 2 (two) times daily. What changed:  You were already taking a medication with the same name, and this prescription was added. Make sure you understand how and when to take each.   polyvinyl alcohol 1.4 % ophthalmic solution Commonly known as:  LIQUIFILM TEARS Place 1 drop into both eyes 3 (three) times daily as needed for dry eyes.   terbinafine 1 % cream Commonly known as:  LAMISIL Apply topically daily.   Travoprost (BAK Free) 0.004 % Soln ophthalmic solution Commonly known as:  TRAVATAN Place 1 drop into the left eye at bedtime.            Durable Medical Equipment        Start  Ordered   12/09/16 1414  For home use only DME 4 wheeled rolling walker with seat  Once    Question:  Patient needs a walker to treat with the following condition  Answer:  Dependent on walker for ambulation   12/09/16 1413   12/09/16 1414  For home use only DME Shower stool  Once     12/09/16 1413      Discharge Instructions: Please refer to Patient Instructions section of EMR for full details.  Patient was counseled important signs and symptoms that should prompt return to medical care, changes in medications, dietary instructions, activity restrictions, and follow up appointments.   Follow-Up Appointments: Follow-up Information    [facility] Follow up.   Specialty:  Home Health Services Why:  home health services arranged , office will call and set up home visits Contact information: [address] [place] [address] [place] [address] [phone]        [name], MD Follow up on 12/12/2016.   Specialty:  Family Medicine Why:  2:50pm Contact information: [address] [place] [address] [phone]           Marthenia Rolling, DO 12/10/2016, 8:47 PM PGY-1, [facility]

## 2016-12-04 NOTE — Clinical Social Work Note (Signed)
CSW acknowledges consult regarding "unsafe living situation." Per notes, patient lives home alone. PT/OT orders placed this morning. CSW will follow for recommendations and possible SNF placement.  Charlynn CourtSarah Faithann Natal, CSW [phone]

## 2016-12-05 ENCOUNTER — Inpatient Hospital Stay (HOSPITAL_COMMUNITY): Payer: Medicare Other

## 2016-12-05 ENCOUNTER — Ambulatory Visit: Payer: Medicare Other | Admitting: Family Medicine

## 2016-12-05 DIAGNOSIS — I4891 Unspecified atrial fibrillation: Secondary | ICD-10-CM

## 2016-12-05 DIAGNOSIS — F4323 Adjustment disorder with mixed anxiety and depressed mood: Secondary | ICD-10-CM | POA: Diagnosis present

## 2016-12-05 MED ORDER — FUROSEMIDE 10 MG/ML IJ SOLN
20.0000 mg | Freq: Once | INTRAMUSCULAR | Status: AC
  Administered 2016-12-06: 20 mg via INTRAVENOUS
  Filled 2016-12-05: qty 2

## 2016-12-05 MED ORDER — IOPAMIDOL (ISOVUE-300) INJECTION 61%
INTRAVENOUS | Status: AC
  Filled 2016-12-05: qty 75

## 2016-12-05 NOTE — Progress Notes (Signed)
Initial Nutrition Assessment  DOCUMENTATION CODES:   Not applicable  INTERVENTION:  1. Magic cup TID with meals, each supplement provides 290 kcal and 9 grams of protein  NUTRITION DIAGNOSIS:   Inadequate oral intake related to poor appetite as evidenced by per patient/family report.  GOAL:   Patient will meet greater than or equal to 90% of their needs  MONITOR:   PO intake, I & O's, Labs, Weight trends  REASON FOR ASSESSMENT:   Consult Assessment of nutrition requirement/status  ASSESSMENT:   Laurie Murphy has a PMH of HTN, DVT, A-flutter, presents with anasarca   Spoke with patient and caregiver at bedside. Patient refusing food, "does not want to waste it." No appetite currently. PO 75% yesterday for lunch. Denies weight loss. Per chart, appears patient has gained weight but caregiver states it is related to fluid. States she normally eats grits for breakfast, and something like chicken scampi for lunch. Often eats the same thing for dinner that she ate for lunch. Small portions. Caregiver then states "she hasn't eaten much of anything lately." Does not normally consume ensure, declined at this time. Will provide more snack and ONS options upon follow-up. Unable to complete NFPE, MD came to see patient during visit. Will complete upon follow-up. At risk for malnutrition given information so far.   Labs reviewed:  K 3.2 Medications reviewed  Diet Order:  Diet Heart Room service appropriate? Yes; Fluid consistency: Thin  EDUCATION NEEDS:   Not appropriate for education at this time  Skin:  Skin Assessment: Reviewed RN Assessment  Last BM:  12/04/2016 (Type 6)  Height:   Ht Readings from Last 1 Encounters:  12/03/16 5\' 5"  (1.651 m)    Weight:   Wt Readings from Last 1 Encounters:  12/03/16 134 lb 6.4 oz (61 kg)    Ideal Body Weight:  56.81 kg  BMI:  Body mass index is 22.37 kg/m.  Estimated Nutritional Needs:   Kcal:  1200-1400 calories  Protein:  79-91  grams (1.3-1.5g/kg)  Fluid:  Per MD in setting of fluid overload  Dionne AnoWilliam M. Zakara Parkey, MS, RD LDN Inpatient Clinical Dietitian Pager [phone]

## 2016-12-05 NOTE — Progress Notes (Signed)
Called for rapid response.  [facility] clear, sat 98% on 4 lpm [place].  HR irregular- RN aware.  Rapid Response RN at bedside, no RT needs currently.

## 2016-12-05 NOTE — Consult Note (Signed)
[facility] Face-to-Face Psychiatry Consult   Reason for Consult:  Capacity evaluation Referring Physician:  Dr. Ardelia Mems Patient Identification: Laurie Murphy MRN:  [id number] Principal Diagnosis: Adjustment disorder with mixed anxiety and depressed mood Diagnosis:   Patient Active Problem List   Diagnosis Date Noted  . Breast mass, right [N63.10]   . Anasarca [R60.1] 12/02/2016  . At high risk for injury related to fall [Z91.81] 11/02/2016  . Leg edema [R60.0] 05/25/2016  . Epidural hematoma ([place]) [S06.4X9A] 05/11/2016  . Typical atrial flutter ([place]) [I48.3] 05/11/2016  . Health care maintenance [Z00.00] 09/03/2011  . Protein-calorie malnutrition ([place]) [E46] 01/03/2011  . Lower leg DVT (deep venous thromboembolism), chronic (HCC) [I82.5Z9] 12/10/2010  . Atrial fibrillation ([place]) [I48.91] 10/02/2010  . OSTEOPOROSIS [M81.0] 10/25/2007  . HYPERTENSION, BENIGN SYSTEMIC [I10] 04/06/2006    Total Time spent with patient: 1 hour  Subjective:   Laurie Murphy is a 83 y.o. female patient admitted with Anasarca  HPI:  Laurie Murphy is a 83 y.o. female, seen, chart reviewed and case discussed with the patient and her family friend who is at bedside for this face-to-face psychiatric consultation for capacity evaluation. Patient is awake, alert, oriented to time place person and situation. She appears to be very fragile and reportedly not able to walk and needed additional services. Patient is able to answer most of the questions related to her Mini-Mental Status Examination scored 28 out of 30, patient uncertain I do not know for a couple of questions related to orientation. Reportedly patient has plans to go to her daughter's home in [place]. Patient is not well and is asking questions related to explanation for her choices to refuse tests suggested by physicians. Patient reported she likes to stay independent living in out-of-home accept home health care is needed. Patient also reported hospital  doctors are requesting additional tests but refused because she is feeling tired off to many test was ordered for her. 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    Past Psychiatric History: None reported.  Risk to Self: Is patient at risk for suicide?: No Risk to Others:   Prior Inpatient Therapy:   Prior Outpatient Therapy:    Past Medical History:  Past Medical History:  Diagnosis Date  . Hypertension   . Shingles    History reviewed. No pertinent surgical history. Family History:  Family History  Problem Relation Age of Onset  . Stroke Mother    Family Psychiatric  History: denied family history of mental illness.  Social History:  History  Alcohol Use No     History  Drug Use No    Social History   Social History  . Marital status: Widowed    Spouse name: N/A  . Number of children: N/A  . Years of education: N/A   Occupational History  . 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 Smoking status: Never Smoker  . Smokeless tobacco: Never Used  . Alcohol use No  . Drug use: No  . Sexual activity: Not Currently   Other Topics Concern  . None   Social History Narrative  . None   Additional Social History:    Allergies:  No Known Allergies  Labs:  Results for orders placed or performed during the hospital encounter of 12/03/16 (from the past 48 hour(s))  CBC     Status: Abnormal   Collection Time: 12/03/16 11:51 AM  Result  Value Ref Range   WBC 6.4 4.0 - 10.5 K/uL   RBC 4.06 3.87 - 5.11 MIL/uL   Hemoglobin 11.6 (L) 12.0 - 15.0 g/dL   HCT 35.3 (L) 36.0 - 46.0 %   MCV 86.9 78.0 - 100.0 fL   MCH 28.6 26.0 - 34.0 pg   MCHC 32.9 30.0 - 36.0 g/dL   RDW 17.3 (H) 11.5 - 15.5 %   Platelets 142 (L) 150 - 400 K/uL  Basic metabolic panel     Status: Abnormal   Collection Time: 12/03/16 11:51 AM  Result Value Ref Range   Sodium 144 135 - 145 mmol/L   Potassium 3.0 (L) 3.5 - 5.1 mmol/L    Chloride 112 (H) 101 - 111 mmol/L   CO2 25 22 - 32 mmol/L   Glucose, Bld 110 (H) 65 - 99 mg/dL   BUN 25 (H) 6 - 20 mg/dL   Creatinine, Ser 0.97 0.44 - 1.00 mg/dL   Calcium 8.2 (L) 8.9 - 10.3 mg/dL   GFR calc non Af Amer 46 (L) >60 mL/min   GFR calc Af Amer 53 (L) >60 mL/min    Comment: (NOTE) The eGFR has been calculated using the CKD EPI equation. This calculation has not been validated in all clinical situations. eGFR's persistently <60 mL/min signify possible Chronic Kidney Disease.    Anion gap 7 5 - 15  Hepatic function panel     Status: Abnormal   Collection Time: 12/03/16 11:51 AM  Result Value Ref Range   Total Protein 6.9 6.5 - 8.1 g/dL   Albumin 2.4 (L) 3.5 - 5.0 g/dL   AST 28 15 - 41 U/L   ALT 21 14 - 54 U/L   Alkaline Phosphatase 106 38 - 126 U/L   Total Bilirubin 1.0 0.3 - 1.2 mg/dL   Bilirubin, Direct 0.1 0.1 - 0.5 mg/dL   Indirect Bilirubin 0.9 0.3 - 0.9 mg/dL  Brain natriuretic peptide     Status: Abnormal   Collection Time: 12/03/16 11:51 AM  Result Value Ref Range   B Natriuretic Peptide 301.8 (H) 0.0 - 100.0 pg/mL  Troponin I     Status: None   Collection Time: 12/03/16  3:45 PM  Result Value Ref Range   Troponin I <0.03 <0.03 ng/mL  Troponin I     Status: None   Collection Time: 12/03/16  7:17 PM  Result Value Ref Range   Troponin I <0.03 <0.03 ng/mL  Prealbumin     Status: Abnormal   Collection Time: 12/03/16  7:17 PM  Result Value Ref Range   Prealbumin 13.6 (L) 18 - 38 mg/dL  Lipid panel     Status: None   Collection Time: 12/03/16  7:17 PM  Result Value Ref Range   Cholesterol 165 0 - 200 mg/dL   Triglycerides 63 <150 mg/dL   HDL 55 >40 mg/dL   Total CHOL/HDL Ratio 3.0 RATIO   VLDL 13 0 - 40 mg/dL   LDL Cholesterol 97 0 - 99 mg/dL    Comment:        Total Cholesterol/HDL:CHD Risk Coronary Heart Disease Risk Table                     Men   Women  1/2 Average Risk   3.4   3.3  Average Risk       5.0   4.4  2 X Average Risk   9.6   7.1  3  X Average Risk  23.4   11.0        Use the calculated Patient Ratio above and the CHD Risk Table to determine the patient's CHD Risk.        ATP III CLASSIFICATION (LDL):  <100     mg/dL   Optimal  100-129  mg/dL   Near or Above                    Optimal  130-159  mg/dL   Borderline  160-189  mg/dL   High  >190     mg/dL   Very High   Troponin I     Status: None   Collection Time: 12/04/16  1:45 AM  Result Value Ref Range   Troponin I <0.03 <0.03 ng/mL  Comprehensive metabolic panel     Status: Abnormal   Collection Time: 12/04/16  4:32 AM  Result Value Ref Range   Sodium 142 135 - 145 mmol/L   Potassium 3.2 (L) 3.5 - 5.1 mmol/L   Chloride 109 101 - 111 mmol/L   CO2 25 22 - 32 mmol/L   Glucose, Bld 106 (H) 65 - 99 mg/dL   BUN 21 (H) 6 - 20 mg/dL   Creatinine, Ser 1.01 (H) 0.44 - 1.00 mg/dL   Calcium 8.2 (L) 8.9 - 10.3 mg/dL   Total Protein 6.8 6.5 - 8.1 g/dL   Albumin 2.4 (L) 3.5 - 5.0 g/dL   AST 30 15 - 41 U/L   ALT 23 14 - 54 U/L   Alkaline Phosphatase 109 38 - 126 U/L   Total Bilirubin 0.6 0.3 - 1.2 mg/dL   GFR calc non Af Amer 44 (L) >60 mL/min   GFR calc Af Amer 50 (L) >60 mL/min    Comment: (NOTE) The eGFR has been calculated using the CKD EPI equation. This calculation has not been validated in all clinical situations. eGFR's persistently <60 mL/min signify possible Chronic Kidney Disease.    Anion gap 8 5 - 15  CBC     Status: Abnormal   Collection Time: 12/04/16  4:32 AM  Result Value Ref Range   WBC 5.8 4.0 - 10.5 K/uL   RBC 4.20 3.87 - 5.11 MIL/uL   Hemoglobin 11.8 (L) 12.0 - 15.0 g/dL   HCT 36.5 36.0 - 46.0 %   MCV 86.9 78.0 - 100.0 fL   MCH 28.1 26.0 - 34.0 pg   MCHC 32.3 30.0 - 36.0 g/dL   RDW 17.3 (H) 11.5 - 15.5 %   Platelets 116 (L) 150 - 400 K/uL    Comment: SPECIMEN CHECKED FOR CLOTS REPEATED TO VERIFY PLATELET COUNT CONFIRMED BY SMEAR   Protime-INR     Status: None   Collection Time: 12/04/16  4:32 AM  Result Value Ref Range    Prothrombin Time 14.6 11.4 - 15.2 seconds   INR 1.15     Current Facility-Administered Medications  Medication Dose Route Frequency Provider Last Rate Last Dose  . acetaminophen (TYLENOL) tablet 650 mg  650 mg Oral Q6H PRN Mercy Riding, MD       Or  . acetaminophen (TYLENOL) suppository 650 mg  650 mg Rectal Q6H PRN Gonfa, Taye T, MD      . latanoprost (XALATAN) 0.005 % ophthalmic solution 1 drop  1 drop Left Eye QHS Mercy Riding, MD   1 drop at 12/03/16 2138  . metoprolol tartrate (LOPRESSOR) tablet 12.5 mg  12.5 mg Oral BID Mercy Riding, MD   12.5  mg at 12/04/16 0937  . polyethylene glycol (MIRALAX / GLYCOLAX) packet 17 g  17 g Oral Daily PRN Gonfa, Taye T, MD      . polyvinyl alcohol (LIQUIFILM TEARS) 1.4 % ophthalmic solution 1 drop  1 drop Both Eyes TID PRN Gonfa, Taye T, MD      . terbinafine (LAMISIL) 1 % cream   Topical Daily Mercy Riding, MD        Musculoskeletal: Strength & Muscle Tone: decreased Gait & Station: unable to stand Patient leans: N/A  Psychiatric Specialty Exam: Physical Exam as per history and physical   ROS patient has swelling on her upper extremities, denies depression, anxiety and feels need to be independent and refused to go for further testing and 11 nursing homes at this time. No Fever-chills, No Headache, No changes with Vision or hearing, reports vertigo No problems swallowing food or Liquids, No Chest pain, Cough or Shortness of Breath, No Abdominal pain, No Nausea or Vommitting, Bowel movements are regular, No Blood in stool or Urine, No dysuria, No new skin rashes or bruises, No new joints pains-aches,  No new weakness, tingling, numbness in any extremity, No recent weight gain or loss, No polyuria, polydypsia or polyphagia,  A full 10 point Review of Systems was done, except as stated above, all other Review of Systems were negative.  Blood pressure 138/76, pulse (!) 145, temperature (!) 95.9 F (35.5 C), temperature source Axillary,  resp. rate (!) 36, height 5' 5"  (1.651 m), weight 61 kg (134 lb 6.4 oz), SpO2 (!) 88 %.Body mass index is 22.37 kg/m.  General Appearance: Guarded  Eye Contact:  Good  Speech:  Clear and Coherent and Slow  Volume:  Decreased  Mood:  Anxious and Depressed  Affect:  Appropriate and Congruent  Thought Process:  Coherent and Goal Directed  Orientation:  Full (Time, Place, and Person)  Thought Content:  WDL  Suicidal Thoughts:  No  Homicidal Thoughts:  No  Memory:  Immediate;   Good Recent;   Fair Remote;   Fair  Judgement:  Fair  Insight:  Fair  Psychomotor Activity:  Decreased  Concentration:  Concentration: Fair and Attention Span: Fair  Recall:  Good  Fund of Knowledge:  Good  Language:  Good  Akathisia:  No  Handed:  Right  AIMS (if indicated):     Assets:  Communication Skills Desire for Improvement Financial Resources/Insurance Housing Leisure Time Resilience Social Support Transportation  ADL's:  Impaired  Cognition:  WNL  Sleep:        Treatment Plan Summary: 83 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 Patient reported she does not want hospital doctors perform additional testing for her chest and heart.   Adjustment disorder with mixed symptoms of depression and anxiety  Recommendation: Based on my evaluation patient does meet criteria for capacity to make her own medical decisions and living arrangements. Patient does not have symptoms of dementia or delirium. Patient and her family friend has been communication with her daughter regarding transportation to daughter's home where she can be cared 24 /7.  Disposition: Supportive therapy provided about ongoing stressors.  Ambrose Finland, MD 12/05/2016 11:16 AM

## 2016-12-05 NOTE — Progress Notes (Signed)
Vital signs of temp 95.9 F, pulse 145, resp 36, spO2 88, blood pressure 138/76. Oxygen 2 L was given to patient. MD was notified and rapid response was called. EKG was done, A fib with RVR was shown. Vitals were rechecked 149/77 blood pressure, pulse 106, resp 32, spO2 of 98. Cardiac monitoring was initiated. MD and DO both assessed the patient. Will continue to monitor patient.

## 2016-12-05 NOTE — Progress Notes (Signed)
Family Medicine Teaching Service Daily Progress Note Intern Pager: [phone]  Patient name: Laurie Murphy Medical record number: [id number] Date of birth: [birth year] Age: 83 y.o. Gender: female  Primary Care Provider: Renne MuscaWarden, Daniel L, MD Consultants:  Code Status: full (patient was undecided)  Pt Overview and Major Events to Date:  Laurie Murphy is a [age] y.o. female presenting with anasarca. PMH is significant for hypertension, DVT, protein calorie malnutrition, epidural hematoma, osteoporosis, atrial flutter, venous insufficiency & fall risk.   Assessment and Plan: Laurie Murphy is a [age] y.o. female presenting with anasarca. PMH is significant for hypertension, DVT, protein calorie malnutrition, epidural hematoma, osteoporosis, atrial flutter, venous insufficiency & fall risk.   Anasarca: with presentation focused on unilateral R arm and physical exam finding of R breast mass, likely lymphatic involvement. Patient has not yet consented to imaging/workup for that. Patient with some underlying heart failure, pulmonary hypertension, aortic stenosis and protein calorie malnutrition which could all contribute to her anasarca. BNP elevated to 302. CXR with interstitial coarsening concerning for CHF versus multifocal infection. Echocardiogram in 05/2016 showed EF of 55-60%, severe aortic stenosis with a valve area of 0.8 cm, mod to severely dilated LA and PAPP of 65 mmHg. Pre-albumen low at 13.6 today. Other, possibilities are cirrhosis & nephrotic syndrome. She is not that anemic with hemoglobin of 11.6. Her weight is up to 134 pounds today. Baseline appears to be 114. PT/INR and lipid panel WNL.  Albumin and prealbumin low at 2.4 and 13.6 respectively.  Echo shows aortic stenosis and restrictive cardiomyopathy -Admit to telemetry. Attending Dr. Pollie MeyerMcIntyre -Strict I and O, daily weights -CT chest, patient and daughter both consent -UA & urine protein/creatinine ratio to rule out nephrotic syndrome.   -Continue cycling troponin to rule out ACS (neg so far) -Nutrition consult ordered -PT/OT eval treat ordered  Atrial flutter/A. Fib without RVR: Not a good candidate for anticoagulation due to her living situation, history of epidural hematoma and high risk for fall. Rate controlled on metoprolol -Continue home metoprolol -We'll continue monitoring  Aortic stenosis: Aortic valve area 0.8 cm on last echo in 05/2016. And doesn't think she is a good surgical candidate for AVR -Will consider touching base with cardiology after CT  History of hypertension: Appears to be on amlodipine unknown. Normotensive now -Stop amlodipine given edema  Protein calorie malnutrition: Albumin and prealbumin low at 2.4 and 13.6 respectively -Nutrition consult  History of DVT: not  any anticoagulation. Not a good candidate either  Tinea pedis: Bilateral -Terbinafine cream daily  Foot ulcer: She has second degree foot ulcer about 1.5 cm in diameter over her right tarsometatarsal joint. -Dressing per nurse -Wound care   Unsafe living condition: She says she lives by herself. She says "someone" gives her a ride when she likes to go shoping. She says she is able to walk with a cane at home. Daughter lives in [place] -Social work consult in the morning -She says it is okay to talk to her daughter but she does want her daughter to make a decision for her.  Nephew has also been present at hospital for visitation, daughter contacted via phone  Code status: Full code for now. Patient doesn't want to make decision. Previously DNR -Clarify in the morning -Consider palliative consults  FEN/GI: -Heart healthy diet  Prophylaxis: -Lovenox  Disposition: Admit to telemetry for evaluation and management of anasarca  Subjective:  Was distraught this morning and refusing all tests/vitals because she was worried.  We connected  her on phone with daughter who was able to calm her and she agreed to  CT.  Objective: Temp:  [97.5 F (36.4 C)] 97.5 F (36.4 C) (10/28 1500) Pulse Rate:  [76] 76 (10/28 1500) Resp:  [18] 18 (10/28 1500) BP: (100)/(75) 100/75 (10/28 1500) SpO2:  [90 %-92 %] 90 % (10/28 1500) Physical Exam: GEN: appears frail, in no acute distress Head: normocephalic and atraumatic  Eyes: conjunctiva without injection, sclera anicteric HEM: negative for cervical or periauricular lymphadenopathies CVS: Irregularly irregular, nl s1 & s2, systolic murmur, 1+ edema in her legs, 2+ in hips and arms, very faint DP in LE/UE RESP: no IWOB, good air movement bilaterally, CTAB anteriorly GI: BS present & normal, soft, NTND. No sign of overt ascites. No mass GU: no suprapubic tenderness MSK: no focal tenderness or notable swelling to legs, she did have significant edema to R arm with no skin breakdown SKIN: Circular second-degree ulcer over her right first tarsometatarsal joint and about 1.5 cm in diameter, no surrounding cellulitic picture NEURO: alert and oiented appropriately, no gross deficits   Laboratory:  Recent Labs Lab 12/03/16 1151 12/04/16 0432  WBC 6.4 5.8  HGB 11.6* 11.8*  HCT 35.3* 36.5  PLT 142* 116*    Recent Labs Lab 12/03/16 1151 12/04/16 0432  NA 144 142  K 3.0* 3.2*  CL 112* 109  CO2 25 25  BUN 25* 21*  CREATININE 0.97 1.01*  CALCIUM 8.2* 8.2*  PROT 6.9 6.8  BILITOT 1.0 0.6  ALKPHOS 106 109  ALT 21 23  AST 28 30  GLUCOSE 110* 106*    Troponin <0.03 x3  Imaging/Diagnostic Tests: Dg Chest 2 View  Result Date: 12/03/2016 CLINICAL DATA:  Shortness of breath EXAM: CHEST  2 VIEW COMPARISON:  10/28/2009 FINDINGS: Low volume chest with coarse interstitial opacities. There is pleural based thickening laterally on the right and at the base on the left. Apparent cardiomegaly. Stable mediastinal contours allowing for rotation. IMPRESSION: 1. Chronic lung disease. Interstitial coarsening above prior baseline with small effusions, CHF versus  multifocal infection. 2. Pleural thickening along the peripheral right chest that is indeterminate. Recommend radiographic follow up after convalescence. Electronically Signed   By: Marnee Spring M.D.   On: 12/03/2016 13:19     Marthenia Rolling, DO 12/05/2016, 6:24 AM PGY-1, [facility] FPTS Intern pager: [phone], text pages welcome

## 2016-12-05 NOTE — Progress Notes (Addendum)
FPTS Interim Progress Note  S: Dr. Primitivo GauzeFletcher and myself went to check on patient tonight. She had an eventful day with a rapid response that was called earlier. Patient was lying in bed awake whimpering tonight. Asked patient what was wrong and she stated that she "messed on myself". Patient states that she needed to get up and use the restroom but couldn't make it. Denies any pain or shortness of breath at this time  O: BP (!) 100/57 (BP Location: Right Leg)   Pulse 60   Temp (!) 95.9 F (35.5 C) (Axillary)   Resp (!) 30   Ht 5\' 5"  (1.651 m)   Wt 134 lb 6.4 oz (61 kg)   SpO2 90%   BMI 22.37 kg/m   Gen: Elderly female laying in bed whimpering Cardiac: Slightly tachycardic rate, irregular rhythm Resp: Tachypnea, Lateral lung fields difficult to hear good air movement  A/P: A. Fib continues to be present on exam, seems to be rate controlled at this time. O2 in the mid 90's on 2 L [place] tonight but patient appears tachypneic. Patient denies any shortness of breath or chest pain. Had CT Chest earlier today which read as CHF with large effusions and atelectasis as well as cardiac enlargement with severely enlarged atrium. Possible neoplasm of right breast and diffuse swelling of left breast. Echocardiogram showed normal EF of 55-60%, no signs of heart failure but there is signs of restrictive cardiomyopathy and severe aortic stenosis as well as increased PA pressure of 69.  - Will order 20 mg Lasix IV once given large bilateral pleural effusions on chest CT as well as tachypnea on exam (however patient denies any shortness of breath).  - Will likely need more diuresis tomorrow and repeat chest imaging afterwards - If aggressive measures are still to be taken, patient may need cardiac/vascular consult and work up for possible breast malignancy. Will defer this to day team - Continue to monitor vitals closely throughout the night  Beaulah DinningGambino, Christina M, MD 12/05/2016, 11:23 PM PGY-3, [facility]  Family Medicine Service pager [phone]

## 2016-12-05 NOTE — Progress Notes (Signed)
Patient refused to take medicine at night time and refused to take her vital signs. md notified.will monitor continue. Notified as needed.

## 2016-12-05 NOTE — Progress Notes (Signed)
Patient with tachypnea and O2 sat 88% on RA, placed on 2l [place].Marland Kitchen.   Difficult to obtain O2 sats,   Upon my arrival patient lying on left side, unable to reposition patient.  Mild increased WOB,  RR 30s  O2 sats  99% on 2L [place]. (probe placed on ear).  BP 149/77  HR 130s.  Lung sounds clear, decreased bases.  12 lead EKG done, RAF  Placed on telemetry.  HR 106-130s.  MD at bedside to assess patient.  Plan CTA of chest. RN to call if assistance needed.

## 2016-12-05 NOTE — Progress Notes (Signed)
OT Cancellation Note  Patient Details Name: Laurie Murphy MRN: [id number] DOB: [birth year]   Cancelled Treatment:    Reason Eval/Treat Not Completed: Medical issues which prohibited therapy.  Events noted.  Will defer OT eval at this time, and will check back once pt medically stable.  Keland Peyton Colmar Manoronarpe, OTR/L [phone]   Jeani HawkingConarpe, Madalee Altmann M 12/05/2016, 1:24 PM

## 2016-12-05 NOTE — Progress Notes (Signed)
PT Cancellation Note  Patient Details Name: Gregary SignsGladys F Oncale MRN: [id number] DOB: 15-Dec-1912   Cancelled Treatment:    Reason Eval/Treat Not Completed: Medical issues which prohibited therapy.  Noted events today that resulted in calling rapid response.  Will check back as able later today. 12/05/2016  [place] BingKen Takyah Ciaramitaro, PT [phone]  (pager)   Eliseo GumKenneth V Garnett Nunziata 12/05/2016, 1:37 PM

## 2016-12-06 DIAGNOSIS — J90 Pleural effusion, not elsewhere classified: Secondary | ICD-10-CM

## 2016-12-06 DIAGNOSIS — I50810 Right heart failure, unspecified: Secondary | ICD-10-CM

## 2016-12-06 DIAGNOSIS — R0902 Hypoxemia: Secondary | ICD-10-CM

## 2016-12-06 DIAGNOSIS — I350 Nonrheumatic aortic (valve) stenosis: Secondary | ICD-10-CM

## 2016-12-06 DIAGNOSIS — R54 Age-related physical debility: Secondary | ICD-10-CM

## 2016-12-06 LAB — BASIC METABOLIC PANEL
Anion gap: 11 (ref 5–15)
BUN: 25 mg/dL — ABNORMAL HIGH (ref 6–20)
CHLORIDE: 111 mmol/L (ref 101–111)
CO2: 23 mmol/L (ref 22–32)
CREATININE: 1.09 mg/dL — AB (ref 0.44–1.00)
Calcium: 8.5 mg/dL — ABNORMAL LOW (ref 8.9–10.3)
GFR, EST AFRICAN AMERICAN: 46 mL/min — AB (ref >60)
GFR, EST NON AFRICAN AMERICAN: 40 mL/min — AB (ref >60)
Glucose, Bld: 106 mg/dL — ABNORMAL HIGH (ref 65–99)
Potassium: 3.8 mmol/L (ref 3.5–5.1)
Sodium: 145 mmol/L (ref 135–145)

## 2016-12-06 LAB — CBC
HCT: 40.5 % (ref 36.0–46.0)
HEMOGLOBIN: 13.3 g/dL (ref 12.0–15.0)
MCH: 28.5 pg (ref 26.0–34.0)
MCHC: 32.8 g/dL (ref 30.0–36.0)
MCV: 86.9 fL (ref 78.0–100.0)
PLATELETS: 137 10*3/uL — AB (ref 150–400)
RBC: 4.66 MIL/uL (ref 3.87–5.11)
RDW: 17.6 % — ABNORMAL HIGH (ref 11.5–15.5)
WBC: 11 10*3/uL — ABNORMAL HIGH (ref 4.0–10.5)

## 2016-12-06 MED ORDER — FUROSEMIDE 10 MG/ML IJ SOLN
20.0000 mg | Freq: Two times a day (BID) | INTRAMUSCULAR | Status: DC
  Administered 2016-12-06: 20 mg via INTRAVENOUS
  Filled 2016-12-06: qty 2

## 2016-12-06 MED ORDER — ALBUMIN HUMAN 25 % IV SOLN
12.5000 g | Freq: Four times a day (QID) | INTRAVENOUS | Status: AC
  Administered 2016-12-06 – 2016-12-07 (×4): 12.5 g via INTRAVENOUS
  Filled 2016-12-06 (×4): qty 50

## 2016-12-06 MED ORDER — FUROSEMIDE 10 MG/ML IJ SOLN
40.0000 mg | Freq: Four times a day (QID) | INTRAMUSCULAR | Status: AC
  Administered 2016-12-06 – 2016-12-07 (×3): 40 mg via INTRAVENOUS
  Filled 2016-12-06 (×4): qty 4

## 2016-12-06 NOTE — Progress Notes (Signed)
Patient refused to take medicine at night and refused to change dressing this morning.

## 2016-12-06 NOTE — Progress Notes (Signed)
FPTS Interim Progress Note   O: BP 97/79 (BP Location: Right Arm)   Pulse 89   Temp (!) 97.5 F (36.4 C) (Oral)   Resp 18   Ht 5\' 5"  (1.651 m)   Wt 131 lb 8 oz (59.6 kg)   SpO2 100%   BMI 21.88 kg/m     A/P: Cardiology consulted over phone to confirm status of patient in terms of potential cardiology intervention so that family will be informed for goals of care meeting.   Patient is NOT a candidate for procedural intervention, cardiology recommends palliative care.  Laurie Murphy, Laurie Vetter, DO 12/06/2016, 4:19 PM PGY-1, [facility] Service pager [phone]

## 2016-12-06 NOTE — Progress Notes (Signed)
Family Medicine Teaching Service Daily Progress Note Intern Pager: [phone]  Patient name: Laurie Murphy Medical record number: [id number] Date of birth: 11-21-1912 Age: 83 y.o. Gender: female  Primary Care Provider: Renne Musca, MD Consultants: ccm, cardio, palliative Code Status: full   Pt Overview and Major Events to Date:  Laurie Murphy is a 83 y.o. female presenting with anasarca. PMH is significant for hypertension, DVT, protein calorie malnutrition, epidural hematoma, osteoporosis, atrial flutter, venous insufficiency & fall risk.   Assessment and Plan: Laurie Murphy is a 83 y.o. female presenting with anasarca. PMH is significant for hypertension, DVT, protein calorie malnutrition, epidural hematoma, osteoporosis, atrial flutter, venous insufficiency & fall risk.   Cardiac concerns (potential cause of edema/effusions) Patient with some underlying heart failure, pulmonary hypertension, aortic stenosis. BNP elevated to 302. CXR with interstitial coarsening concerning for CHF versus multifocal infection. Echocardiogram in 05/2016 showed EF of 55-60%, severe aortic stenosis with a valve area of 0.8 cm, mod to severely dilated LA and PAPP of 65 mmHg. Her weight is up to 134 pounds today. Baseline appears to be 114 -consulting cards -will attempt to diurese with lasix -goal of care discussion with family 10/31  Bilateral Effusions: potentially cardiac related, potential related to R breast mass -consulted CCM -will evaluate for potential thoracentesies -goal of care meeting with family 10/31  R breast mass: Goals of care meeting AM 10/31 -will consider consult oncology based on results of that meeting  Pre-albumen low at 13.6 today. Other, possibilities are cirrhosis & nephrotic syndrome. She is not that anemic with hemoglobin of 11.6. Her weight is up to 134 pounds today. Baseline appears to be 114. PT/INR and lipid panel WNL.  Albumin and prealbumin low at 2.4 and 13.6  respectively.  Echo shows aortic stenosis and restrictive cardiomyopathy -Admit to telemetry. Attending Dr. Pollie Meyer -Strict I and O, daily weights -CT chest, patient and daughter both consent -UA & urine protein/creatinine ratio to rule out nephrotic syndrome.  -Continue cycling troponin to rule out ACS (neg so far) -Nutrition consult ordered -PT/OT eval treat ordered  Atrial flutter/A. Fib without RVR: Not a good candidate for anticoagulation due to her living situation, history of epidural hematoma and high risk for fall. Rate controlled on metoprolol -Continue home metoprolol -We'll continue monitoring  History of hypertension: Appears to be on amlodipine unknown. Normotensive now -Stop amlodipine given edema  Protein calorie malnutrition: Albumin and prealbumin low at 2.4 and 13.6 respectively -Nutrition consult  History of DVT: not  any anticoagulation. Not a good candidate either  Tinea pedis: Bilateral -Terbinafine cream daily  Foot ulcer: She has second degree foot ulcer about 1.5 cm in diameter over her right tarsometatarsal joint. -Dressing per nurse -Wound care   Unsafe living condition: She says she lives by herself. She says "someone" gives her a ride when she likes to go shoping. She says she is able to walk with a cane at home. Daughter lives in [place] -Social work consult in the morning -She says it is okay to talk to her daughter but she does want her daughter to make a decision for her.  Nephew has also been present at hospital for visitation, daughter contacted via phone  Code status: Full code for now. Patient doesn't want to make decision. Previously DNR -Clarify in the morning -Consider palliative consults  Laurie Murphy: -Heart healthy diet  Prophylaxis: -Lovenox  Disposition: Admit to telemetry for evaluation and management of anasarca, PT/OT workup pending  Subjective:  Patient was  alert to conversation in AM but somnolent in PM.   Family  friend notes that daughter is coming in from out of town overnight.  We will attempt further discussion tommorow with palliative.  Objective: Temp:  [95.9 F (35.5 C)-97.6 F (36.4 C)] 97.6 F (36.4 C) (10/30 0536) Pulse Rate:  [60-145] 77 (10/30 0536) Resp:  [17-36] 17 (10/30 0536) BP: (100-149)/(57-77) 134/73 (10/30 0536) SpO2:  [75 %-100 %] 100 % (10/30 0536) Physical Exam: GEN: appears frail, somnolent Head: normocephalic and atraumatic  CVS: Irregularly irregular, nl s1 & s2, systolic murmur, 1+ edema in her legs, 2+ in hips and arms, very faint DP in LE/UE RESP: no IWOB, good air movement bilaterally, CTAB anteriorly GI: BS present & normal, soft, NTND. No sign of overt ascites. No mass GU: no suprapubic tenderness MSK: no focal tenderness or notable swelling to legs, she did have significant edema to R arm with no skin breakdown SKIN: Circular second-degree ulcer over her right first tarsometatarsal joint and about 1.5 cm in diameter, no surrounding cellulitic picture NEURO: somnolent in PM, alert in AM and able to converse competently  Laboratory:  Recent Labs Lab 12/03/16 1151 12/04/16 0432  WBC 6.4 5.8  HGB 11.6* 11.8*  HCT 35.3* 36.5  PLT 142* 116*    Recent Labs Lab 12/03/16 1151 12/04/16 0432  NA 144 142  K 3.0* 3.2*  CL 112* 109  CO2 25 25  BUN 25* 21*  CREATININE 0.97 1.01*  CALCIUM 8.2* 8.2*  PROT 6.9 6.8  BILITOT 1.0 0.6  ALKPHOS 106 109  ALT 21 23  AST 28 30  GLUCOSE 110* 106*    Imaging/Diagnostic Tests: Dg Chest 2 View  Result Date: 12/03/2016 CLINICAL DATA:  Shortness of breath EXAM: CHEST  2 VIEW COMPARISON:  10/28/2009 FINDINGS: Low volume chest with coarse interstitial opacities. There is pleural based thickening laterally on the right and at the base on the left. Apparent cardiomegaly. Stable mediastinal contours allowing for rotation. IMPRESSION: 1. Chronic lung disease. Interstitial coarsening above prior baseline with small  effusions, CHF versus multifocal infection. 2. Pleural thickening along the peripheral right chest that is indeterminate. Recommend radiographic follow up after convalescence. Electronically Signed   By: Marnee SpringJonathon  Watts M.D.   On: 12/03/2016 13:19     Marthenia RollingBland, Sean Macwilliams, DO 12/06/2016, 6:44 AM PGY-1, [facility] FPTS Intern pager: [phone], text pages welcome

## 2016-12-06 NOTE — Consult Note (Signed)
Name: Laurie Murphy MRN: [id number] DOB: [birth year]    ADMISSION DATE:  12/03/2016 CONSULTATION DATE:  10/30  REFERRING MD :  Dr. Gwendolyn Grant  CHIEF COMPLAINT:  Pleural effusion  HISTORY OF PRESENT ILLNESS:  83 year old female with PMH as below, which is significant for hypertension, DVT, malnutrition, and epidural hematoma. She has remained relatively healthy for most of her life. She lives alone and suffered at least one fall within the past few months and has been relying much more on friends to help her with ADLs and this has been progressively worse since the fall(s). She presented to family medicine clinic for anasarca and was admitted for diuresis. She has had some response to this, but it has been limited by hypotension. Echo found severe AS. Imaging of the chest reveals volume overload with large bilateral effusions. PCCM has been asked to evaluate for possible thoracentesis.   SIGNIFICANT EVENTS  10/27 admit   STUDIES:  CT chest 10/30 > Cardiac enlargement with marked enlargement of the atria. Reflux of contrast material down the IVC and into the liver most likely due to right heart failure. CHF with large effusions and atelectasis. Suspect right nipple/areolar and subareolar process, possible neoplasm. Diffuse and marked subcutaneous soft tissue swelling/edema/fluid most notably involving the left breast. Advanced atherosclerotic calcifications involving the thoracic and abdominal aorta and branch vessels.  PAST MEDICAL HISTORY :   has a past medical history of Hypertension and Shingles.  has no past surgical history on file. Prior to Admission medications   Medication Sig Start Date End Date Taking? Authorizing Provider  acetaminophen (TYLENOL) 325 MG tablet Take 2 tablets (650 mg total) by mouth every 6 (six) hours as needed for mild pain (or Fever >/= 101). 05/14/16  Yes Osei-Bonsu, Greggory Stallion, MD  amLODipine (NORVASC) 10 MG tablet Take 1 tablet (10 mg total) by mouth daily. 08/01/16   Yes Ardith Dark, MD  carboxymethylcellulose (REFRESH PLUS) 0.5 % SOLN Place 1 drop into the left eye 3 (three) times daily as needed (for dry eyes).    Yes [provider]  ketoconazole (NIZORAL) 2 % cream Apply 1 application topically daily. Do not apply in between toes 11/03/16  Yes Price, Nolon Bussing, DPM  metoprolol tartrate (LOPRESSOR) 25 MG tablet Take 0.5 tablets (12.5 mg total) by mouth 2 (two) times daily. 05/14/16  Yes Osei-Bonsu, Greggory Stallion, MD  polyvinyl alcohol (LIQUIFILM TEARS) 1.4 % ophthalmic solution Place 1 drop into both eyes 3 (three) times daily as needed for dry eyes. 05/14/16  Yes Osei-Bonsu, Greggory Stallion, MD  Travoprost, BAK Free, (TRAVATAMN) 0.004 % SOLN ophthalmic solution Place 1 drop into the left eye at bedtime.     Yes [provider]   No Known Allergies  FAMILY HISTORY:  family history includes Stroke in her mother. SOCIAL HISTORY:  reports that she has never smoked. She has never used smokeless tobacco. She reports that she does not drink alcohol or use drugs.  REVIEW OF SYSTEMS:  Limited due to lethargy Bolds are positive  Constitutional: weight loss, gain, night sweats, Fevers, chills, fatigue .  HEENT: headaches, Sore throat, sneezing, nasal congestion, post nasal drip, Difficulty swallowing, Tooth/dental problems, visual complaints visual changes, ear ache CV:  chest pain, radiates:,Orthopnea, PND, swelling in all extremities, dizziness, palpitations, syncope.  GI  heartburn, indigestion, abdominal pain, nausea, vomiting, diarrhea, change in bowel habits, loss of appetite, bloody stools.  Resp: cough, productive:, hemoptysis, dyspnea, chest pain, pleuritic.  Skin: rash or itching or icterus GU:  dysuria, change in color of urine, urgency or frequency. flank pain, hematuria  MS: joint pain or swelling. decreased range of motion  Psych: change in mood or affect. depression or anxiety.  Neuro: difficulty with speech, weakness, numbness, ataxia    SUBJECTIVE:   VITAL SIGNS: Temp:  [97.5 F (36.4 C)-97.6 F (36.4 C)] 97.5 F (36.4 C) (10/30 1449) Pulse Rate:  [77-127] 89 (10/30 1449) Resp:  [17-18] 18 (10/30 1449) BP: (97-145)/(73-100) 97/79 (10/30 1449) SpO2:  [100 %] 100 % (10/30 1449) Weight:  [59.6 kg (131 lb 8 oz)] 59.6 kg (131 lb 8 oz) (10/30 0536)  PHYSICAL EXAMINATION: General:  Frail elderly female in NAD Neuro:  Alert, oriented, non-focal HEENT:  [place]/AT, PERRL, JVD to jawline Cardiovascular:  RRR, SEM 3/6 Lungs:  Diminished bases, coarse apices.  Abdomen:  Soft, non-tender, non-distended Musculoskeletal:  Significant anasarca Skin:  Grossly intact   Recent Labs Lab 12/03/16 1151 12/04/16 0432 12/06/16 0624  NA 144 142 145  K 3.0* 3.2* 3.8  CL 112* 109 111  CO2 25 25 23   BUN 25* 21* 25*  CREATININE 0.97 1.01* 1.09*  GLUCOSE 110* 106* 106*    Recent Labs Lab 12/03/16 1151 12/04/16 0432 12/06/16 0624  HGB 11.6* 11.8* 13.3  HCT 35.3* 36.5 40.5  WBC 6.4 5.8 11.0*  PLT 142* 116* 137*   Ct Chest W Contrast  Result Date: 12/05/2016 CLINICAL DATA:  Right breast mass. EXAM: CT CHEST WITH CONTRAST TECHNIQUE: Multidetector CT imaging of the chest was performed during intravenous contrast administration. CONTRAST:  75 cc Isovue 300 COMPARISON:  Chest CT 10/28/2009 FINDINGS: Cardiovascular: The heart is moderately enlarged. Particular marked enlargement of the right atrium is noted. The left atrium is also moderately enlarged. Reflux of contrast down the IVC could be due to right heart failure or tricuspid regurgitation. Moderate tortuosity, ectasia and calcification of the thoracic aorta but no focal aneurysm or dissection. Calcifications are noted at the aortic valve. Mediastinum/Nodes: Scattered mediastinal and hilar lymph nodes without mass or overt adenopathy. Fluid noted in the pericardial recesses. Lungs/Pleura: Large bilateral pleural effusion with significant atelectasis. Very little aerated lung on  the left. Interstitial edema is also noted. No definite infiltrates. Calcified granuloma noted in the right wound. Upper Abdomen: No significant upper abdominal findings. There is tortuosity and dense calcification of the abdominal aorta and branch vessels. No obvious mass or adenopathy. Musculoskeletal: Massively enlarged left breast with extensive edema. No obvious right breast right breast swelling. Prominent nipple and areolar region and subareolar soft tissue density. Could not exclude a mass. No obvious supraclavicular or axillary adenopathy. The thyroid gland is grossly normal. Diffuse and severe subcutaneous soft tissue swelling/edema suggesting anasarca. IMPRESSION: 1. Cardiac enlargement with marked enlargement of the atria. 2. Reflux of contrast material down the IVC and into the liver most likely due to right heart failure. 3. CHF with large effusions and atelectasis. 4. Suspect right nipple/areolar and subareolar process, possible neoplasm. 5. Diffuse and marked subcutaneous soft tissue swelling/edema/fluid most notably involving the left breast. 6. Advanced atherosclerotic calcifications involving the thoracic and abdominal aorta and branch vessels. Aortic Atherosclerosis (ICD10-I70.0). Electronically Signed   By: Rudie MeyerP.  Gallerani M.D.   On: 12/05/2016 15:54    ASSESSMENT / PLAN:  Bilateral pleural effusions: most likely related to AS, volume overload, and poor nutrition. Less likely related to breast mass.  - Supplemental O2 as needed to keep SpO2 > 90% - diuresis as tolerated. Reasonable to give albumin first given hypoalbuminemia.  -  Cardiology to see.  - Will defer thoracentesis for now given family conversation with palliative pending tomorrow. While she looks dyspneic she does not endorse any distress.   Protein calorie malnutrition - consider dietician involvement  Failure to thrive - palliative following, family friend seems to understand the situation well. Daughter will be in town  tomorrow.   Joneen Roach, AGACNP-BC [facility] Pager [phone] or [phone]  12/06/2016 3:39 PM  Attending Note:  83 year old female with heart failure due to AS and anasarca presenting to PCCM with concern for bilateral pleural effusion.  Patient is in no respiratory distress on exam.  I reviewed CT myself, bilateral pleural effusions noted.  Discussed with resident and PCCM-NP.  Pleural effusion:  - No thora, this is likely heart failure related, likelihood of relation between that and the suspected breast mass is minimal and in a patient in her overall condition and age, even if related I would not treat that therefore, performing a procedure to evaluate if a malignant pleural effusion is present or not would be not indicated.  - Diureses as below.  Anasarca:  - Lasix as ordered  - Add albumin 25%, 50 ml IV q6 x4 doses along with lasix 40 mg IV q6 x3 doses then strict I/O.  Hypoxemia:  - Titrate O2 for sat of 88-92%  - May need orders for home O2 upon discharge  CHF:  - Resume home heart failure medications.  - Need to consider palliation  PCCM will sign off, please call back if needed.  Patient seen and examined, agree with above note.  I dictated the care and orders written for this patient under my direction.  Alyson Reedy, MD [phone]

## 2016-12-06 NOTE — Progress Notes (Signed)
Palliative Medicine Team  Visit with patient at bedside. Close family friend at bedside. Laurie Murphy gives me permission to talk in front of her friend. Introduced palliative medicine. Patient is alert, oriented, but does not seem to fully understand hospital diagnoses after short discussion. Tells me daughter is coming from [place] tonight. Patient is agreeable with palliative f/u in AM when daughter is available.   NO CHARGE  Vennie HomansMegan Dong Nimmons, FNP-C Palliative Medicine Team  Phone: [phone] Fax: [phone]

## 2016-12-06 NOTE — Progress Notes (Signed)
OT Cancellation Note  Patient Details Name: Laurie Murphy MRN: [id number] DOB: 1912-10-01   Cancelled Treatment:    Reason Eval/Treat Not Completed: Patient at procedure or test/ unavailable.  Pt with another provider.  Rosario Kushner Husononarpe, OTR/L [phone]   Jeani HawkingConarpe, [name] 12/06/2016, 3:40 PM

## 2016-12-06 NOTE — Progress Notes (Signed)
[name] MRN: [id number] DOB: 02-Oct-1912   Cancelled Treatment:    Reason Eval/Treat Not Completed: Patient declined, no reason specified.  Pt stated that she and family were dealing with important matters presently and she didn't want to participate.  Will try back today as able. 12/06/2016  Coleville BingKen Wilbert Schouten, PT [phone]  (pager)   Eliseo GumKenneth V Duwayne Matters 12/06/2016, 12:19 PM

## 2016-12-07 DIAGNOSIS — Z515 Encounter for palliative care: Secondary | ICD-10-CM

## 2016-12-07 DIAGNOSIS — Z7189 Other specified counseling: Secondary | ICD-10-CM

## 2016-12-07 LAB — BASIC METABOLIC PANEL
Anion gap: 11 (ref 5–15)
BUN: 30 mg/dL — AB (ref 6–20)
CO2: 25 mmol/L (ref 22–32)
Calcium: 8.4 mg/dL — ABNORMAL LOW (ref 8.9–10.3)
Chloride: 107 mmol/L (ref 101–111)
Creatinine, Ser: 1.1 mg/dL — ABNORMAL HIGH (ref 0.44–1.00)
GFR calc Af Amer: 45 mL/min — ABNORMAL LOW (ref >60)
GFR, EST NON AFRICAN AMERICAN: 39 mL/min — AB (ref >60)
Glucose, Bld: 96 mg/dL (ref 65–99)
POTASSIUM: 3.4 mmol/L — AB (ref 3.5–5.1)
SODIUM: 143 mmol/L (ref 135–145)

## 2016-12-07 LAB — URINALYSIS, ROUTINE W REFLEX MICROSCOPIC
Bilirubin Urine: NEGATIVE
GLUCOSE, UA: NEGATIVE mg/dL
Ketones, ur: NEGATIVE mg/dL
Leukocytes, UA: NEGATIVE
Nitrite: NEGATIVE
PROTEIN: NEGATIVE mg/dL
SPECIFIC GRAVITY, URINE: 1.008 (ref 1.005–1.030)
pH: 5 (ref 5.0–8.0)

## 2016-12-07 LAB — CBC
HCT: 39.9 % (ref 36.0–46.0)
Hemoglobin: 13.2 g/dL (ref 12.0–15.0)
MCH: 28.6 pg (ref 26.0–34.0)
MCHC: 33.1 g/dL (ref 30.0–36.0)
MCV: 86.6 fL (ref 78.0–100.0)
PLATELETS: 102 10*3/uL — AB (ref 150–400)
RBC: 4.61 MIL/uL (ref 3.87–5.11)
RDW: 17.4 % — ABNORMAL HIGH (ref 11.5–15.5)
WBC: 10.4 10*3/uL (ref 4.0–10.5)

## 2016-12-07 MED ORDER — POTASSIUM CHLORIDE CRYS ER 20 MEQ PO TBCR
40.0000 meq | EXTENDED_RELEASE_TABLET | Freq: Once | ORAL | Status: AC
  Administered 2016-12-07: 40 meq via ORAL
  Filled 2016-12-07: qty 2

## 2016-12-07 MED ORDER — POTASSIUM CHLORIDE CRYS ER 20 MEQ PO TBCR
40.0000 meq | EXTENDED_RELEASE_TABLET | ORAL | Status: AC
  Filled 2016-12-07 (×2): qty 2

## 2016-12-07 NOTE — Progress Notes (Signed)
OT Cancellation Note  Patient Details Name: Laurie Murphy MRN: [id number] DOB: Jun 02, 1912   Cancelled Treatment:    Reason Eval/Treat Not Completed: Pt deferred at this time, due to wanting to finish meal, and having visitors.  Will reattempt.  Chikita Dogan Berryonarpe, OTR/L [phone]  Jeani HawkingConarpe, Darwyn Ponzo M 12/07/2016, 3:11 PM

## 2016-12-07 NOTE — Progress Notes (Signed)
SATURATION QUALIFICATIONS: (This note is used to comply with regulatory documentation for home oxygen)  Patient Saturations on Room Air at Rest = 98%  Patient Saturations on Room Air while Ambulating = -%  Patient Saturations on 2 Liters of oxygen while Ambulating = -%  Please briefly explain why patient needs home oxygen: Patient unable to stand. Staff able to check sat O2 at rest and with patient sitting at the edge of the bed on RA. Lying in bed sat O2=98% RA and sitting sat O2=90% RA.

## 2016-12-07 NOTE — Progress Notes (Signed)
Family Medicine Teaching Service Daily Progress Note Intern Pager: [phone]  Patient name: Laurie Murphy Medical record number: [id number] Date of birth: 06-May-1912 Age: [age] y.o. Gender: female  Primary Care Provider: Renne MuscaWarden, Daniel L, MD Consultants: ccm, cardio, palliative Code Status: full   Pt Overview and Major Events to Date:  Laurie SignsGladys F Godbolt is a [age] y.o. female presenting with anasarca. PMH is significant for hypertension, DVT, protein calorie malnutrition, epidural hematoma, osteoporosis, atrial flutter, venous insufficiency & fall risk.   Assessment and Plan: Laurie SignsGladys F Kahler is a [age] y.o. female presenting with anasarca. PMH is significant for hypertension, DVT, protein calorie malnutrition, epidural hematoma, osteoporosis, atrial flutter, venous insufficiency & fall risk.   Cardiac concerns (potential cause of edema/effusions) Patient with some underlying heart failure, pulmonary hypertension, aortic stenosis. BNP elevated to 302. CXR with interstitial coarsening concerning for CHF versus multifocal infection. Echocardiogram in 05/2016 showed EF of 55-60%, severe aortic stenosis with a valve area of 0.8 cm, mod to severely dilated LA and PAPP of 65 mmHg. Her weight is up to 127 down from 134 pounds on admission. Baseline appears to be 114 -patient is not a candidate for procedural intervention per cardiology -will attempt to diurese with lasix -goal of care discussion with family 10/31  Bilateral Effusions: potentially cardiac related, potential related to R breast mass -Patient is not a candidate for thoracentesis or other procedural intervention per ccm -will attempt to diurese, ccm thinks likely cardiac in origin -goal of care meeting with family 10/31  R breast mass: Goals of care meeting AM 10/31 -will consider consult oncology based on results of that meeting  Pre-albumen low at 13.6 today. Other, possibilities are cirrhosis & nephrotic syndrome. She is not that anemic  with hemoglobin of 11.6. Her weight is up to 134 pounds today. Baseline appears to be 114. PT/INR and lipid panel WNL.  Albumin and prealbumin low at 2.4 and 13.6 respectively.  Echo shows aortic stenosis and restrictive cardiomyopathy -Admit to telemetry. Attending Dr. Pollie MeyerMcIntyre -Strict I and O, daily weights -albumin ordered -Nutrition consult ordered -PT/OT eval treat ordered  Atrial flutter/A. Fib without RVR: Not a good candidate for anticoagulation due to her living situation, history of epidural hematoma and high risk for fall. Rate controlled on metoprolol -Continue home metoprolol -We'll continue monitoring  Hypothermia- 97.5 oral this AM -consider warming blanket  History of hypertension: Appears to be on amlodipine unknown. Normotensive now -Stop amlodipine given edema  Hypokalemia:  Replenish with kdur, recheck in AM  Protein calorie malnutrition: Albumin and prealbumin low at 2.4 and 13.6 respectively -Nutrition consult  History of DVT: not  any anticoagulation. Not a good candidate either  Tinea pedis: Bilateral -Terbinafine cream daily  Foot ulcer: She has second degree foot ulcer about 1.5 cm in diameter over her right tarsometatarsal joint. -Dressing per nurse -Wound care   Code status: Full code for now. Patient doesn't want to make decision. Previously DNR - palliative GOC meeting 10/31  FEN/GI: -Heart healthy diet  Prophylaxis: -Lovenox  Disposition: Patient to meet with palliative and daughter 10/31, currently desire home health and eventual move to [place]  Subjective:  Currently desires home health and eventual move to [place], will discuss code status with her physician relative.  Feels breathing has sligthly improved and edema is slightly better  Objective: Temp:  [97.5 F (36.4 C)-97.7 F (36.5 C)] 97.5 F (36.4 C) (10/31 0546) Pulse Rate:  [53-127] 67 (10/31 0546) Resp:  [17-18] 18 (10/31 0546) BP: (97-153)/(79-100)  148/88 (10/31  0546) SpO2:  [94 %-100 %] 94 % (10/31 0546) Physical Exam: GEN: appears frail, clearly fatigued, speaking in short sentences but clear minded Head: normocephalic and atraumatic  CVS: Irregularly irregular, nl s1 & s2, systolic murmur, no edema in her legs, 2+ in hips and arms (specifically L today as opposed to R), very faint DP in LE/UE RESP: IWOB, decreased breath sounds from ausculatating at back and more clear anteriorly (likely due to fluid distribution) GI: BS present & normal, soft, NTND. No mass noted in abdomen GU: no suprapubic tenderness MSK: no focal tenderness or notable swelling to legs, she did have significant edema to R arm with no skin breakdown SKIN: Circular second-degree ulcer over her right first tarsometatarsal joint and about 1.5 cm in diameter, no surrounding cellulitic picture NEURO: somnolent in PM, alert in AM and able to converse competently  Laboratory:  Recent Labs Lab 12/03/16 1151 12/04/16 0432 12/06/16 0624  WBC 6.4 5.8 11.0*  HGB 11.6* 11.8* 13.3  HCT 35.3* 36.5 40.5  PLT 142* 116* 137*    Recent Labs Lab 12/03/16 1151 12/04/16 0432 12/06/16 0624  NA 144 142 145  K 3.0* 3.2* 3.8  CL 112* 109 111  CO2 25 25 23   BUN 25* 21* 25*  CREATININE 0.97 1.01* 1.09*  CALCIUM 8.2* 8.2* 8.5*  PROT 6.9 6.8  --   BILITOT 1.0 0.6  --   ALKPHOS 106 109  --   ALT 21 23  --   AST 28 30  --   GLUCOSE 110* 106* 106*    Imaging/Diagnostic Tests: Dg Chest 2 View  Result Date: 12/03/2016 CLINICAL DATA:  Shortness of breath EXAM: CHEST  2 VIEW COMPARISON:  10/28/2009 FINDINGS: Low volume chest with coarse interstitial opacities. There is pleural based thickening laterally on the right and at the base on the left. Apparent cardiomegaly. Stable mediastinal contours allowing for rotation. IMPRESSION: 1. Chronic lung disease. Interstitial coarsening above prior baseline with small effusions, CHF versus multifocal infection. 2. Pleural thickening along the  peripheral right chest that is indeterminate. Recommend radiographic follow up after convalescence. Electronically Signed   By: Marnee Spring M.D.   On: 12/03/2016 13:19     Marthenia Rolling, DO 12/07/2016, 6:14 AM PGY-1, [facility] FPTS Intern pager: [phone], text pages welcome

## 2016-12-07 NOTE — Progress Notes (Signed)
  Subjective:  Patient ID: Laurie Murphy, female    DOB: 06-15-1912,  MRN: [id number]  Chief Complaint  Patient presents with  . Wound Check    bilateral feet/ankle   [age] y.o. female returns for the above complaint.   Objective:    Assessment & Plan:  Patient was evaluated and treated and all questions answered.  Return in about 3 weeks (around 12/01/2016).

## 2016-12-07 NOTE — Consult Note (Signed)
Consultation Note Date: 12/07/2016   Patient Name: Laurie Murphy  DOB: 03-Jan-1913  MRN: [id number]  Age / Sex: 83 y.o., female  PCP: Eloise Levels, MD Referring Physician: Alveda Reasons, MD  Reason for Consultation: Establishing goals of care  HPI/Patient Profile: 83 y.o. female  with past medical history of hypertension, DVT, afib/flutter, epidural hematoma, shingles, and protein calorie malnutrition admitted on 12/03/2016 with swelling. In ED, BNP 302 and chest xray revealed CHF versus infection. CT with multiple concerns including breast mass, bilateral pleural effusions, enlarge atrium and right heart failure. ECHO in 05/2016 showed EF 55-60% and severe aortic stenosis. PCCM and cardiology evaluated. Patient is not a candidate for cardiac procedural interventions. PCCM deferring thoracentesis at this time. Medical management with metoprolol and lasix. Palliative medicine consultation for goals of care.   Clinical Assessment and Goals of Care: I have reviewed medical records, discussed with care team, and met with patient and daughter Rudi Rummage) at bedside to discuss diagnosis, prognosis, GOC, EOL wishes, disposition and options. Patient is awake, alert, and oriented. Able to participate in conversation.   Introduced Palliative Medicine as specialized medical care for people living with serious illness. It focuses on providing relief from the symptoms and stress of a serious illness. The goal is to improve quality of life for both the patient and the family.  We discussed a brief life review of the patient. 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 This past year, she raised over $400,000 for [facility] in [place]. Highly educated family including a Chief Executive Officer, dentist, anaesthesiologist, and pharmacist. Prior to hospitalization, patient  living home independently. Daughter speaks of her being very health-conscious throughout her life and with minimal hospitalizations. She had a fall in September and since the fall, Rudi Rummage has been considering moving her to [place] (where she lives) in order to better care for her. She actually was supposed to fly there today.   Discussed hospital diagnoses and interventions. The patient and Rudi Rummage discussed with Dr. Criss Rosales this morning and have a good understanding of diagnoses of heart failure, aortic stenosis, and pleural effusions. They understand she is not a candidate for cardiac surgical procedures and will be treated with medical management. They do not wish to pursue aggressive workup for breast mass.   I attempted to elicit values and goals of care important to the patient. Rudi Rummage becomes tearful during the conversation. It has been most important to move her mother to [place]. Knowing it is not advised she travel now, Rudi Rummage feels her mother would want to be at home. Ms. Cervenka tells [place] she wants to go home on discharge. Rudi Rummage is interested in list of caregivers so her mother is not home alone. Today, the patient is most worried about having her personal accountant visit the hospital.   Advanced directives, concepts specific to code status, and artifical feeding and hydration were discussed. Rudi Rummage is unsure if her mother has a documented living will or HCPOA. The patient tells  [place] she has been working with an Forensic psychologist and they "have it under control." Asked Ms. Jacque her thoughts on resuscitation/life support if her heart was to stop and she died. Explained what a code blue entails. The patient nods her head NO during this part of the conversation. Johnese encourages her mom to get this in writing and agrees that she does not want her mother to "be harmed" or "suffer" at the EOL.  Educated on MOST form. Educated on medical recommendation for DNR/DNI with age, frailty, and poor outcomes of  CPR/meaningful chance of recovery if she survived. Rudi Rummage was willing to complete today but the patient became very irritable stating "I feel pressured." Reassured Ms. Schult that we respect any decisions she makes regarding code status/EOL wishes.    Palliative care and hospice services outpatient were explained and offered. Rudi Rummage does not want to talk about hospice. "That will scare her." The patient wants services to continue through doctors making house calls. She remains irritable and tells me she doesn't need extra support from palliative.   Questions and concerns were addressed.  Hard Choices booklet left for review.     SUMMARY OF RECOMMENDATIONS    FULL code  Continue medical management. Patient/daughter understand she is NOT a candidate for aggressive cardiac workup.   They likely will NOT pursue aggressive oncology workup for breast mass.   Patient wishes to return home on discharge. RN CM notified to provide daughter with private duty caregiver list.   Not ready for hospice services. May benefit from continued support from palliative outpatient for further [place] conversations.   Code Status/Advance Care Planning: Full code-patient considering DNR/DNI but feels pushed to make changes today.   Symptom Management:   Per attending  Palliative Prophylaxis:   Aspiration, Delirium Protocol, Oral Care and Turn Reposition  Psycho-social/Spiritual:   Desire for further Chaplaincy support: yes  Additional Recommendations: Caregiving  Support/Resources and Education on Hospice  Prognosis:   Unable to determine: guarded with acute heart failure, bilateral pleural effusions, severe aortic stenosis, protein calorie malnutrition, and overall failure to thrive.   Discharge Planning: To Be Determined      Primary Diagnoses: Present on Admission: . Anasarca . Adjustment disorder with mixed anxiety and depressed mood   I have reviewed the medical record, interviewed the  patient and family, and examined the patient. The following aspects are pertinent.  Past Medical History:  Diagnosis Date  . Hypertension   . Shingles    Social History   Social History  . Marital status: Widowed    Spouse name: N/A  . Number of children: N/A  . Years of education: N/A   Occupational History  . 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 Smoking status: Never Smoker  . Smokeless tobacco: Never Used  . Alcohol use No  . Drug use: No  . Sexual activity: Not Currently   Other Topics Concern  . None   Social History Narrative  . None   Family History  Problem Relation Age of Onset  . Stroke Mother    Scheduled Meds: . furosemide  40 mg Intravenous Q6H  . latanoprost  1 drop Left Eye QHS  . metoprolol tartrate  12.5 mg Oral BID  . terbinafine   Topical Daily   Continuous Infusions: . albumin human     PRN Meds:.acetaminophen **OR** acetaminophen, polyethylene glycol, polyvinyl alcohol Medications Prior to Admission:  Prior to Admission medications  Medication Sig Start Date End Date Taking? Authorizing Provider  acetaminophen (TYLENOL) 325 MG tablet Take 2 tablets (650 mg total) by mouth every 6 (six) hours as needed for mild pain (or Fever >/= 101). 05/14/16  Yes Osei-Bonsu, Iona Beard, MD  amLODipine (NORVASC) 10 MG tablet Take 1 tablet (10 mg total) by mouth daily. 08/01/16  Yes Vivi Barrack, MD  carboxymethylcellulose (REFRESH PLUS) 0.5 % SOLN Place 1 drop into the left eye 3 (three) times daily as needed (for dry eyes).    Yes [provider]  ketoconazole (NIZORAL) 2 % cream Apply 1 application topically daily. Do not apply in between toes 11/03/16  Yes Price, Christian Mate, DPM  metoprolol tartrate (LOPRESSOR) 25 MG tablet Take 0.5 tablets (12.5 mg total) by mouth 2 (two) times daily. 05/14/16  Yes Osei-Bonsu, Iona Beard, MD  polyvinyl alcohol (LIQUIFILM TEARS) 1.4 % ophthalmic solution Place 1 drop into  both eyes 3 (three) times daily as needed for dry eyes. 05/14/16  Yes Osei-Bonsu, Iona Beard, MD  Travoprost, BAK Free, (TRAVATAMN) 0.004 % SOLN ophthalmic solution Place 1 drop into the left eye at bedtime.     Yes [provider]   No Known Allergies Review of Systems  Physical Exam  Constitutional: She is oriented to person, place, and time. She is cooperative. She appears ill.  Cardiovascular: Regular rhythm.   Pulmonary/Chest: No accessory muscle usage. No tachypnea. No respiratory distress. She has decreased breath sounds.  Abdominal: Normal appearance.  Neurological: She is alert and oriented to person, place, and time.  Skin: Skin is warm and dry.  Psychiatric: Her speech is normal. Cognition and memory are normal.  irritable  Nursing note and vitals reviewed.  Vital Signs: BP (!) 148/88 (BP Location: Left Wrist)   Pulse 94   Temp (!) 97.5 F (36.4 C) (Oral)   Resp 18   Ht 5' 5"  (1.651 m)   Wt 58 kg (127 lb 13.9 oz)   SpO2 94%   BMI 21.28 kg/m  Pain Assessment: No/denies pain   Pain Score: 0-No pain  SpO2: SpO2: 94 % O2 Device:SpO2: 94 % O2 Flow Rate: .O2 Flow Rate (L/min): 2 L/min  IO: Intake/output summary:   Intake/Output Summary (Last 24 hours) at 12/07/16 1026 Last data filed at 12/07/16 0174  Gross per 24 hour  Intake              160 ml  Output             1200 ml  Net            -1040 ml    LBM: Last BM Date: 12/04/16 Baseline Weight: Weight: 60.8 kg (134 lb) Most recent weight: Weight: 58 kg (127 lb 13.9 oz)     Palliative Assessment/Data: PPS 40%   Flowsheet Rows     Most Recent Value  Intake Tab  Referral Department  Hospitalist  Unit at Time of Referral  Cardiac/Telemetry Unit  Palliative Care Primary Diagnosis  Cardiac  Palliative Care Type  New Palliative care  Reason for referral  Clarify Goals of Care  Date first seen by Palliative Care  12/07/16  Clinical Assessment  Palliative Performance Scale Score  40%  Psychosocial &  Spiritual Assessment  Palliative Care Outcomes  Patient/Family meeting held?  Yes  Who was at the meeting?  daughter and patient  Palliative Care Outcomes  Clarified goals of care, Counseled regarding hospice, Provided end of life care assistance, ACP counseling assistance, Provided psychosocial or spiritual  support      Time In: 0920 Time Out: 1030 Time Total: 78mn Greater than 50%  of this time was spent counseling and coordinating care related to the above assessment and plan.  Signed by:  MIhor Dow FNP-C Palliative Medicine Team  Phone: [phone]: [phone]  Please contact Palliative Medicine Team phone at [phone] questions and concerns.  For individual provider: See AShea Evans

## 2016-12-07 NOTE — Progress Notes (Signed)
PT Cancellation Note  Patient Details Name: Laurie Murphy MRN: [id number] DOB: Jul 05, 1912   Cancelled Treatment:    Reason Eval/Treat Not Completed: Patient declined, no reason specified.  Pt wanting to visit, declines therapy.   12/07/2016  Lackawanna BingKen Fareedah Mahler, PT [phone]  (pager)   Laurie Murphy 12/07/2016, 3:58 PM

## 2016-12-08 ENCOUNTER — Ambulatory Visit (HOSPITAL_COMMUNITY): Admission: RE | Admit: 2016-12-08 | Payer: Medicare Other | Source: Ambulatory Visit

## 2016-12-08 ENCOUNTER — Ambulatory Visit: Payer: Medicare Other | Admitting: Podiatry

## 2016-12-08 DIAGNOSIS — I350 Nonrheumatic aortic (valve) stenosis: Secondary | ICD-10-CM

## 2016-12-08 DIAGNOSIS — M2550 Pain in unspecified joint: Secondary | ICD-10-CM

## 2016-12-08 DIAGNOSIS — I50813 Acute on chronic right heart failure: Secondary | ICD-10-CM

## 2016-12-08 LAB — BASIC METABOLIC PANEL
Anion gap: 10 (ref 5–15)
BUN: 29 mg/dL — AB (ref 6–20)
CHLORIDE: 106 mmol/L (ref 101–111)
CO2: 28 mmol/L (ref 22–32)
Calcium: 8.2 mg/dL — ABNORMAL LOW (ref 8.9–10.3)
Creatinine, Ser: 0.98 mg/dL (ref 0.44–1.00)
GFR calc Af Amer: 52 mL/min — ABNORMAL LOW (ref >60)
GFR calc non Af Amer: 45 mL/min — ABNORMAL LOW (ref >60)
Glucose, Bld: 85 mg/dL (ref 65–99)
POTASSIUM: 3 mmol/L — AB (ref 3.5–5.1)
SODIUM: 144 mmol/L (ref 135–145)

## 2016-12-08 MED ORDER — POTASSIUM CHLORIDE CRYS ER 20 MEQ PO TBCR
40.0000 meq | EXTENDED_RELEASE_TABLET | ORAL | Status: AC
  Administered 2016-12-08 (×2): 40 meq via ORAL
  Filled 2016-12-08 (×3): qty 2

## 2016-12-08 MED ORDER — BLISTEX MEDICATED EX OINT
TOPICAL_OINTMENT | CUTANEOUS | Status: DC | PRN
  Administered 2016-12-08: 1 via TOPICAL
  Filled 2016-12-08: qty 6.3

## 2016-12-08 NOTE — Progress Notes (Signed)
MD was informed that the patient is refusing Potassium po. Patient's daughter is aware. Will continue to monitor.

## 2016-12-08 NOTE — Progress Notes (Addendum)
Malfunction with low bed in patient room.  Unable to weigh the patient. Will continue to monitor

## 2016-12-08 NOTE — Care Management Important Message (Signed)
Important Message  Patient Details  Name: Laurie Murphy MRN: [id number] Date of Birth: 10-Dec-1912   Medicare Important Message Given:  Yes    Khandi Kernes Stefan ChurchBratton 12/08/2016, 12:39 PM

## 2016-12-08 NOTE — Progress Notes (Addendum)
Nutrition Follow-up  DOCUMENTATION CODES:   Not applicable  INTERVENTION:  Recommend liberalize diet, encourage PO intake Magic cup TID with meals, each supplement provides 290 kcal and 9 grams of protein  NUTRITION DIAGNOSIS:   Inadequate oral intake related to poor appetite as evidenced by per patient/family report. -ongoing GOAL:   Patient will meet greater than or equal to 90% of their needs -not meeting  MONITOR:   PO intake, I & O's, Labs, Weight trends  ASSESSMENT:   Laurie Murphy has a PMH of HTN, DVT, A-flutter, presents with anasarca  Continues to be agitated, poor PO intake, refusing meals. DNR now, desires home health and eventual move to [place]. Unable to complete NFPE due to agitation. Labs and medications reviewed.  Diet Order:  Diet Heart Room service appropriate? Yes; Fluid consistency: Thin  EDUCATION NEEDS:   Not appropriate for education at this time  Skin:  Skin Assessment: Reviewed RN Assessment  Last BM:  12/04/2016 (Type 6)  Height:   Ht Readings from Last 1 Encounters:  12/03/16 5\' 5"  (1.651 m)    Weight:   Wt Readings from Last 1 Encounters:  12/08/16 125 lb 10.6 oz (57 kg)    Ideal Body Weight:  56.81 kg  BMI:  Body mass index is 20.91 kg/m.  Estimated Nutritional Needs:   Kcal:  1200-1400 calories  Protein:  79-91 grams (1.3-1.5g/kg)  Fluid:  Per MD in setting of fluid overload  Laurie AnoWilliam M. Veneda Kirksey, MS, RD LDN Inpatient Clinical Dietitian Pager [phone]

## 2016-12-08 NOTE — Progress Notes (Signed)
Family Medicine Teaching Service Daily Progress Note Intern Pager: [phone]  Patient name: Laurie Murphy Medical record number: [id number] Date of birth: 1912-11-16 Age: 83 y.o. Gender: female  Primary Care Provider: Renne Musca, MD Consultants: ccm, cardio, palliative Code Status: DNR  Pt Overview and Major Events to Date:  Laurie Murphy is a 83 y.o. female presenting with anasarca. PMH is significant for hypertension, DVT, protein calorie malnutrition, epidural hematoma, osteoporosis, atrial flutter, venous insufficiency & fall risk.   Assessment and Plan: Laurie Murphy is a 83 y.o. female presenting with anasarca. PMH is significant for hypertension, DVT, protein calorie malnutrition, epidural hematoma, osteoporosis, atrial flutter, venous insufficiency & fall risk.   Cardiac concerns (potential cause of edema/effusions) Patient with some underlying heart failure, pulmonary hypertension, aortic stenosis. BNP elevated to 302. CXR with interstitial coarsening concerning for CHF versus multifocal infection. Echocardiogram in 05/2016 showed EF of 55-60%, severe aortic stenosis with a valve area of 0.8 cm, mod to severely dilated LA and PAPP of 65 mmHg. Her weight is up to 127 down from 134 pounds on admission. Baseline appears to be 114 -patient is not a candidate for procedural intervention per cardiology -will attempt to diurese with lasix -continue home metoprolol -will discuss further medication optimization but there may not be much else to do medicinally   Bilateral Effusions: potentially cardiac related, potential related to R breast mass. Patient is not a candidate for thoracentesis or other procedural intervention per ccm -will reassess clinical presentation s/p yesterday's diuresis  R breast mass: Patient declines workup and oncology referal per palliative meeting  Hypoalbuminemia.s/p 3 doses 10/30 overnight -Strict I and O, daily weights -Nutrition recs -will  consider retesting/additional orders given clinical progression  Atrial flutter/A. Fib without RVR: Not a good candidate for anticoagulation due to her living situation, history of epidural hematoma and high risk for fall. Rate controlled on metoprolol -Continue home metoprolol -We'll continue monitoring  Hypothermia- 97.5 oral this AM -consider warming blanket  History of hypertension: Appears to be on amlodipine unknown. Normotensive now -Stop amlodipine given edema  Hypokalemia:  Replenish with kdur, recheck in AM  Protein calorie malnutrition: Albumin and prealbumin low at 2.4 and 13.6 respectively -Nutrition consult  History of DVT: not  any anticoagulation. Not a good candidate either  Tinea pedis: Bilateral -Terbinafine cream daily  Foot ulcer: She has second degree foot ulcer about 1.5 cm in diameter over her right tarsometatarsal joint. -Dressing per nurse -Wound care   Code status: DNR  FEN/GI: -Heart healthy diet  Prophylaxis: -Lovenox  Disposition: Patient to meet with palliative and daughter 10/31, currently desire home health and eventual move to [place]  Subjective:  Currently desires home health and eventual move to [place].  Decided on DNR status with daughter present in room.  Feels her breathing and edema have improved since yesterday.   Agrees to PT/OT eval after understanding it is step in getting home health assistance.  Objective: Temp:  [97.5 F (36.4 C)-97.9 F (36.6 C)] 97.5 F (36.4 C) (10/31 2103) Pulse Rate:  [78-94] 78 (10/31 2103) Resp:  [18] 18 (10/31 2103) BP: (137-143)/(81-93) 137/81 (10/31 2103) SpO2:  [96 %-98 %] 96 % (10/31 2103) Physical Exam: GEN: appears frail, clear minded and able to speak in full sentences today Head: normocephalic and atraumatic  CVS: Irregularly irregular, nl s1 & s2, systolic murmur, no edema in her legs, 2+ in hips and elbows (still migratory edema, slightly improved), very faint DP in  LE/UE RESP: (  improved from yesterday slightly) decreased breath sounds from ausculatating at back and more clear anteriorly (likely due to fluid distribution) GI: BS present & normal, soft, NTND. No mass noted in abdomen GU: no suprapubic tenderness MSK: no focal tenderness aside from ulcer, or notable swelling to legs, she did have significant edema to R arm with no skin breakdown SKIN: Circular second-degree ulcer over her right first tarsometatarsal joint and about 1.5 cm in diameter, no surrounding cellulitic picture NEURO: no focal deficits noted    Laboratory:  Recent Labs Lab 12/04/16 0432 12/06/16 0624 12/07/16 0658  WBC 5.8 11.0* 10.4  HGB 11.8* 13.3 13.2  HCT 36.5 40.5 39.9  PLT 116* 137* 102*    Recent Labs Lab 12/03/16 1151 12/04/16 0432 12/06/16 0624 12/07/16 0658  NA 144 142 145 143  K 3.0* 3.2* 3.8 3.4*  CL 112* 109 111 107  CO2 25 25 23 25   BUN 25* 21* 25* 30*  CREATININE 0.97 1.01* 1.09* 1.10*  CALCIUM 8.2* 8.2* 8.5* 8.4*  PROT 6.9 6.8  --   --   BILITOT 1.0 0.6  --   --   ALKPHOS 106 109  --   --   ALT 21 23  --   --   AST 28 30  --   --   GLUCOSE 110* 106* 106* 96    Imaging/Diagnostic Tests: Dg Chest 2 View  Result Date: 12/03/2016 CLINICAL DATA:  Shortness of breath EXAM: CHEST  2 VIEW COMPARISON:  10/28/2009 FINDINGS: Low volume chest with coarse interstitial opacities. There is pleural based thickening laterally on the right and at the base on the left. Apparent cardiomegaly. Stable mediastinal contours allowing for rotation. IMPRESSION: 1. Chronic lung disease. Interstitial coarsening above prior baseline with small effusions, CHF versus multifocal infection. 2. Pleural thickening along the peripheral right chest that is indeterminate. Recommend radiographic follow up after convalescence. Electronically Signed   By: Marnee SpringJonathon  Watts M.D.   On: 12/03/2016 13:19     Marthenia RollingBland, Jenisse Vullo, DO 12/08/2016, 7:34 AM PGY-1, [facility] FPTS  Intern pager: [phone], text pages welcome

## 2016-12-08 NOTE — Consult Note (Signed)
[facility] Face-to-Face Psychiatry Consult   Reason for Consult:  Capacity  Referring Physician:  Dr. Mingo Amber Patient Identification: Laurie Murphy MRN:  [id number] Principal Diagnosis: Adjustment disorder with mixed anxiety and depressed mood Diagnosis:   Patient Active Problem List   Diagnosis Date Noted  . Palliative care by specialist [Z51.5]   . Chronic bilateral pleural effusions [J90]   . Frail elderly [R54]   . Right-sided heart failure ([place]) [I50.810]   . Adjustment disorder with mixed anxiety and depressed mood [F43.23] 12/05/2016  . Breast mass, right [N63.10]   . Anasarca [R60.1] 12/02/2016  . At high risk for injury related to fall [Z91.81] 11/02/2016  . Leg edema [R60.0] 05/25/2016  . Epidural hematoma ([place]) [S06.4X9A] 05/11/2016  . Typical atrial flutter ([place]) [I48.3] 05/11/2016  . Goals of care, counseling/discussion [Z71.89] 09/03/2011  . Protein-calorie malnutrition ([place]) [E46] 01/03/2011  . Lower leg DVT (deep venous thromboembolism), chronic (HCC) [I82.5Z9] 12/10/2010  . Atrial fibrillation ([place]) [I48.91] 10/02/2010  . OSTEOPOROSIS [M81.0] 10/25/2007  . HYPERTENSION, BENIGN SYSTEMIC [I10] 04/06/2006    Total Time spent with patient: 30 minutes  Subjective:   Laurie Murphy is a 83 y.o. female patient admitted with anasarca and treated with diuresis. Psychiatry was consulted for capacity to refuse care.  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 She reports that her recent decline ("sickness") has been difficult to accept. She admits to realizing at this time that her condition has deteriorated and she is willing to live with her daughter so that she can get help managing her daily activities. She reports that her mood has been happy. She denies SI, depression or anxiety. She denies problems with sleep or appetite. She enjoys playing sudoku. She was able to name her  medical conditions, medications and reason for admission to the hospital. She was also oriented to person, time and place. She demonstrates capacity to make medical decisions to refuse care although she may need assistance with more complex medical decisions. She has a good relationship with her daughter. It would be recommended to have the patient's daughter assist with more complex medical decisions if the patient does not appear to understand.  The patient's daughter was present for a brief part of the interview with the patient's consent. She reports that she is working on arrangements for her mother following discharge. She plans to bring her to live in [place] when she is able to safely travel.    Past Psychiatric History: Denies   Risk to Self: Is patient at risk for suicide?: No Risk to Others:  No Prior Inpatient Therapy:  No Prior Outpatient Therapy:  No  Past Medical History:  Past Medical History:  Diagnosis Date  . Hypertension   . Shingles    History reviewed. No pertinent surgical history. Family History:  Family History  Problem Relation Age of Onset  . Stroke Mother    Family Psychiatric  History: Denies  Social History:  History  Alcohol Use No     History  Drug Use No    Social History   Social History  . Marital status: Widowed    Spouse name: N/A  . Number of children: N/A  . Years of education: N/A   Occupational History  . 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 Smoking status: Never Smoker  . Smokeless tobacco:  Never Used  . Alcohol use No  . Drug use: No  . Sexual activity: Not Currently   Other Topics Concern  . None   Social History Narrative  . None   Additional Social History: She lives at home alone. Her daughter lives in [place]. She is retired. She was a professor at [employer]. She has her doctorate in education.     Allergies:  No Known Allergies  Labs:  Results for orders placed or  performed during the hospital encounter of 12/03/16 (from the past 48 hour(s))  Urinalysis, Routine w reflex microscopic     Status: Abnormal   Collection Time: 12/07/16  3:58 AM  Result Value Ref Range   Color, Urine STRAW (A) YELLOW   APPearance CLEAR CLEAR   Specific Gravity, Urine 1.008 1.005 - 1.030   pH 5.0 5.0 - 8.0   Glucose, UA NEGATIVE NEGATIVE mg/dL   Hgb urine dipstick SMALL (A) NEGATIVE   Bilirubin Urine NEGATIVE NEGATIVE   Ketones, ur NEGATIVE NEGATIVE mg/dL   Protein, ur NEGATIVE NEGATIVE mg/dL   Nitrite NEGATIVE NEGATIVE   Leukocytes, UA NEGATIVE NEGATIVE   RBC / HPF 0-5 0 - 5 RBC/hpf   WBC, UA 0-5 0 - 5 WBC/hpf   Bacteria, UA RARE (A) NONE SEEN   Squamous Epithelial / LPF 0-5 (A) NONE SEEN  CBC     Status: Abnormal   Collection Time: 12/07/16  6:58 AM  Result Value Ref Range   WBC 10.4 4.0 - 10.5 K/uL   RBC 4.61 3.87 - 5.11 MIL/uL   Hemoglobin 13.2 12.0 - 15.0 g/dL    Comment: CONSISTENT WITH PREVIOUS RESULT   HCT 39.9 36.0 - 46.0 %   MCV 86.6 78.0 - 100.0 fL   MCH 28.6 26.0 - 34.0 pg   MCHC 33.1 30.0 - 36.0 g/dL   RDW 17.4 (H) 11.5 - 15.5 %   Platelets 102 (L) 150 - 400 K/uL    Comment: PLATELET COUNT CONFIRMED BY SMEAR  Basic metabolic panel     Status: Abnormal   Collection Time: 12/07/16  6:58 AM  Result Value Ref Range   Sodium 143 135 - 145 mmol/L   Potassium 3.4 (L) 3.5 - 5.1 mmol/L   Chloride 107 101 - 111 mmol/L   CO2 25 22 - 32 mmol/L   Glucose, Bld 96 65 - 99 mg/dL   BUN 30 (H) 6 - 20 mg/dL   Creatinine, Ser 1.10 (H) 0.44 - 1.00 mg/dL   Calcium 8.4 (L) 8.9 - 10.3 mg/dL   GFR calc non Af Amer 39 (L) >60 mL/min   GFR calc Af Amer 45 (L) >60 mL/min    Comment: (NOTE) The eGFR has been calculated using the CKD EPI equation. This calculation has not been validated in all clinical situations. eGFR's persistently <60 mL/min signify possible Chronic Kidney Disease.    Anion gap 11 5 - 15  Basic metabolic panel     Status: Abnormal   Collection  Time: 12/08/16  7:08 AM  Result Value Ref Range   Sodium 144 135 - 145 mmol/L   Potassium 3.0 (L) 3.5 - 5.1 mmol/L   Chloride 106 101 - 111 mmol/L   CO2 28 22 - 32 mmol/L   Glucose, Bld 85 65 - 99 mg/dL   BUN 29 (H) 6 - 20 mg/dL   Creatinine, Ser 0.98 0.44 - 1.00 mg/dL   Calcium 8.2 (L) 8.9 - 10.3 mg/dL   GFR calc non Af Wyvonnia Lora  45 (L) >60 mL/min   GFR calc Af Amer 52 (L) >60 mL/min    Comment: (NOTE) The eGFR has been calculated using the CKD EPI equation. This calculation has not been validated in all clinical situations. eGFR's persistently <60 mL/min signify possible Chronic Kidney Disease.    Anion gap 10 5 - 15    Current Facility-Administered Medications  Medication Dose Route Frequency Provider Last Rate Last Dose  . acetaminophen (TYLENOL) tablet 650 mg  650 mg Oral Q6H PRN Mercy Riding, MD       Or  . acetaminophen (TYLENOL) suppository 650 mg  650 mg Rectal Q6H PRN Gonfa, Taye T, MD      . latanoprost (XALATAN) 0.005 % ophthalmic solution 1 drop  1 drop Left Eye QHS Mercy Riding, MD   1 drop at 12/07/16 2116  . metoprolol tartrate (LOPRESSOR) tablet 12.5 mg  12.5 mg Oral BID Wendee Beavers T, MD   12.5 mg at 12/08/16 0920  . polyethylene glycol (MIRALAX / GLYCOLAX) packet 17 g  17 g Oral Daily PRN Gonfa, Taye T, MD      . polyvinyl alcohol (LIQUIFILM TEARS) 1.4 % ophthalmic solution 1 drop  1 drop Both Eyes TID PRN Gonfa, Taye T, MD      . potassium chloride SA (K-DUR,KLOR-CON) CR tablet 40 mEq  40 mEq Oral Q3H Bland, Scott, DO      . terbinafine (LAMISIL) 1 % cream   Topical Daily Mercy Riding, MD        Musculoskeletal: Strength & Muscle Tone: Decreased strength and lying in bed.  Gait & Station: UTA since lying in bed.  Patient leans: N/A  Psychiatric Specialty Exam: Physical Exam  Nursing note and vitals reviewed. Constitutional: She is oriented to person, place, and time. She appears well-developed.  HENT:  Head: Normocephalic and atraumatic.  Neck: Normal  range of motion.  Respiratory: Effort normal.  Musculoskeletal: Normal range of motion.  Neurological: She is alert and oriented to person, place, and time.  Psychiatric: She has a normal mood and affect. Her behavior is normal. Judgment and thought content normal.    Review of Systems  Musculoskeletal: Positive for joint pain.  Psychiatric/Behavioral: Negative for depression, hallucinations, substance abuse and suicidal ideas. The patient is not nervous/anxious and does not have insomnia.     Blood pressure 137/81, pulse 78, temperature (!) 97.5 F (36.4 C), temperature source Oral, resp. rate 18, height 5' 5"  (1.651 m), weight 58 kg (127 lb 13.9 oz), SpO2 96 %.Body mass index is 21.28 kg/m.  General Appearance: Well Groomed, elderly African American female with long gray hair and corrective lenses who is lying in bed on her side. NAD.   Eye Contact:  Good  Speech:  Normal Rate  Volume:  Normal  Mood:  Euthymic  Affect:  Congruent  Thought Process:  Goal Directed and Linear  Orientation:  Full (Time, Place, and Person)  Thought Content:  Logical  Suicidal Thoughts:  No  Homicidal Thoughts:  No  Memory:  Immediate;   Fair Recent;   Fair Remote;   Fair  Judgement:  Good  Insight:  Good  Psychomotor Activity:  Normal  Concentration:  Concentration: Good and Attention Span: Good  Recall:  Good  Fund of Knowledge:  Good  Language:  Good  Akathisia:  No  Handed:  Right  AIMS (if indicated):     Assets:  Agricultural consultant Housing Social Support  ADL's:  Intact with  assistance.  Cognition:  WNL  Sleep:   Okay   Assessment:  Ms. Pilger demonstrates capacity to refuse treatment at this time although she shows good insight about her condition and accepts that she cannot live alone any longer. She is agreeable to allowing her daughter to help her and plans to move to live with her daughter. She may need assistance with making more complex medical  decisions. She has a good relationship with her daughter and she would likely be the best choice for helping her make these decisions as needed.   Treatment Plan Summary: -Patient demonstrates capacity to refuse treatment but recommend having her daughter or another person she chooses to help her make more complex medical decisions as needed. -Will sign off on patient. Please consult psychiatry as needed.   Disposition: No evidence of imminent risk to self or others at present.   Patient does not meet criteria for psychiatric inpatient admission. Supportive therapy provided about ongoing stressors.  Faythe Dingwall, DO 12/08/2016 12:28 PM

## 2016-12-08 NOTE — Progress Notes (Signed)
DO NOT RESUSCITATE  O: BP 137/81 (BP Location: Left Arm)   Pulse 78   Temp (!) 97.5 F (36.4 C) (Oral)   Resp 18   Ht 5\' 5"  (1.651 m)   Wt 127 lb 13.9 oz (58 kg)   SpO2 96%   BMI 21.28 kg/m     A/P: Patient confirmed verbally to Dr. Parke SimmersBland and pharmacy student Durward MallardMichael Li with daughter present in the room  Marthenia RollingBland, Bernhardt Riemenschneider, DO 12/08/2016, 9:09 AM PGY-1, [facility] pager [phone]

## 2016-12-08 NOTE — Evaluation (Signed)
Physical Therapy Evaluation Patient Details Name: Laurie Murphy F Dosh MRN: [id number] DOB: 1912/02/12 Today's Date: 12/08/2016   History of Present Illness  [age] y.o. female presenting with anasarca. PMH is significant for hypertension, DVT, protein calorie malnutrition, epidural hematoma, osteoporosis, atrial flutter, venous insufficiency & fall risk.   Clinical Impression  Pt admitted with above diagnosis. Pt currently with functional limitations due to the deficits listed below (see PT Problem List). Pt is limited in her mobility, by decreased strength and balance. Pt will benefit from skilled PT in the acute setting to increase their independence and safety with mobility in preparation for d/c. Pt currently requiring Max A for unsupported sitting balance, Max A +2 for bed mobility, transfers and ambulation of 3 feet with quad cane. Pt's daughter reporting that they plan to dc to pt's house and then move to daughter's [place] in [place]. Recommend dc to SNF for further PT to improve strength and balance to be able to navigate in her home environment. However, if pt and family decide to dc home, pt will require ambulance transfer into house, 24 hour assist/suppervision and HHPT.     Follow Up Recommendations SNF    Equipment Recommendations  Other (comment) (to be determined at next venue)    Recommendations for Other Services       Precautions / Restrictions Precautions Precautions: Fall Restrictions Weight Bearing Restrictions: No      Mobility  Bed Mobility Overal bed mobility: Needs Assistance Bed Mobility: Supine to Sit     Supine to sit: Max assist;+2 for physical assistance     General bed mobility comments: Max A to bring hips towards EOB with pad and trunk to upright. Required Max A to bring legs off bed. Able to initiate movement of legs and reach towards bedrail  Transfers Overall transfer level: Needs assistance Equipment used: 1 person hand held assist Transfers: Sit  to/from Stand Sit to Stand: Max assist;+2 safety/equipment         General transfer comment: Able to power up with Mod A and required Max A for steadying  Ambulation/Gait Ambulation/Gait assistance: Max assist;+2 physical assistance Ambulation Distance (Feet): 2 Feet Assistive device: Rolling walker (2 wheeled) Gait Pattern/deviations: Step-to pattern;Decreased step length - right;Decreased step length - left;Shuffle;Trunk flexed;Narrow base of support Gait velocity: slowed Gait velocity interpretation: Below normal speed for age/gender General Gait Details: maxAx2 for keeping upright, steadying with quad cane, vc for sequencing of quad cane, and upright posture         Balance Overall balance assessment: Needs assistance Sitting-balance support: Bilateral upper extremity supported;Feet supported Sitting balance-Leahy Scale: Zero Sitting balance - Comments: Max A to maintain unsupported sitting at EOB.  Postural control: Posterior lean;Left lateral lean Standing balance support: Bilateral upper extremity supported;During functional activity Standing balance-Leahy Scale: Zero Standing balance comment: Max A +2 to maintain standing balance                             Pertinent Vitals/Pain Pain Assessment: Faces Faces Pain Scale: Hurts a little bit Pain Location: Generalized Pain Descriptors / Indicators: Discomfort;Grimacing Pain Intervention(s): Limited activity within patient's tolerance;Monitored during session;Repositioned    Home Living Family/patient expects to be discharged to:: Private residence Living Arrangements: Alone Available Help at Discharge: Family;Available 24 hours/day Type of Home: House Home Access: Stairs to enter     Home Layout: Able to live on main level with bedroom/bathroom;Laundry or work area in [employer]wide Mutual Insurancebasement Home Equipment: Pensions consultantToilet  riser;Cane - quad Additional Comments: Daughter says dc home till they move to [place]    Prior Function  Level of Independence: Independent with assistive device(s)         Comments: Per pt and daughter, performing ADLs and light IADLs.  Recently using cane for functional mobility.     Hand Dominance   Dominant Hand: Right    Extremity/Trunk Assessment   Upper Extremity Assessment Upper Extremity Assessment: Generalized weakness    Lower Extremity Assessment Lower Extremity Assessment: Generalized weakness    Cervical / Trunk Assessment Cervical / Trunk Assessment: Kyphotic  Communication   Communication: HOH  Cognition Arousal/Alertness: Awake/alert Behavior During Therapy: WFL for tasks assessed/performed Overall Cognitive Status: Within Functional Limits for tasks assessed                                 General Comments: Decreased memory, attention, and problem solving. WFL of age      General Comments General comments (skin integrity, edema, etc.): Daughter present throughout session, VSS throughout, SaO2 on 2L O2 98%O2        Assessment/Plan    PT Assessment Patient needs continued PT services  PT Problem List Decreased strength;Decreased range of motion;Decreased activity tolerance;Decreased balance;Decreased mobility;Decreased cognition       PT Treatment Interventions DME instruction;Gait training;Stair training;Functional mobility training;Therapeutic activities;Therapeutic exercise;Balance training;Patient/family education;Cognitive remediation    PT Goals (Current goals can be found in the Care Plan section)  Acute Rehab PT Goals Patient Stated Goal: Go home PT Goal Formulation: With patient/family Time For Goal Achievement: 12/22/16 Potential to Achieve Goals: Fair    Frequency Min 2X/week   Barriers to discharge Inaccessible home environment pt does not have strength to climb stairs and has 3 steps in and one step up into bathroom    Co-evaluation PT/OT/SLP Co-Evaluation/Treatment: Yes Reason for Co-Treatment: For  patient/therapist safety PT goals addressed during session: Mobility/safety with mobility;Balance OT goals addressed during session: ADL's and self-care       AM-PAC PT "6 Clicks" Daily Activity  Outcome Measure Difficulty turning over in bed (including adjusting bedclothes, sheets and blankets)?: Unable Difficulty moving from lying on back to sitting on the side of the bed? : Unable Difficulty sitting down on and standing up from a chair with arms (e.g., wheelchair, bedside commode, etc,.)?: Unable Help needed moving to and from a bed to chair (including a wheelchair)?: A Lot Help needed walking in hospital room?: Total Help needed climbing 3-5 steps with a railing? : Total 6 Click Score: 7    End of Session Equipment Utilized During Treatment: Gait belt;Oxygen Activity Tolerance: Other (comment) (limited by generalized weakness) Patient left: in chair;with call bell/phone within reach;with family/visitor present Nurse Communication: Mobility status PT Visit Diagnosis: Unsteadiness on feet (R26.81);Other abnormalities of gait and mobility (R26.89);Muscle weakness (generalized) (M62.81);Difficulty in walking, not elsewhere classified (R26.2);History of falling (Z91.81)    Time: 9604-5409 PT Time Calculation (min) (ACUTE ONLY): 31 min   Charges:   PT Evaluation $PT Eval Moderate Complexity: 1 Mod     PT G Codes:        Yuval Rubens B. Beverely Risen PT, DPT Acute Rehabilitation  [phone] Pager [phone]    Elon Alas Fleet 12/08/2016, 5:13 PM

## 2016-12-08 NOTE — Evaluation (Signed)
Occupational Therapy Evaluation Patient Details Name: Laurie Murphy MRN: [id number] DOB: [birth year] Today's Date: 12/08/2016    History of Present Illness [age] y.o. female presenting with anasarca. PMH is significant for hypertension, DVT, protein calorie malnutrition, epidural hematoma, osteoporosis, atrial flutter, venous insufficiency & fall risk.    Clinical Impression   PTA, pt was living alone and performing ADLs and light IADLs. Pt currently requiring Max A for unsupported sitting balance, Mod A for UB ADLs, Max A for LB ADLs, Max A +2 for functional mobility. Pt presenting with decreased strength, balance, and activity tolerance. Pt's daughter reporting that they plan to dc to pt's house and then move to daughter's [place] in [place]. Pt would benefit form further acute OT to facilitate safe dc. Recommend dc to SNF for further OT to increase safety and independence with ADLs and functional mobility. However, if pt and family decide to dc home, pt will require 24 hour assist/suppervision and HHOT.     Follow Up Recommendations  SNF;Supervision/Assistance - 24 hour    Equipment Recommendations  Other (comment) (Educated pt and family on shower seat; daughter reporting she will purchase)    Recommendations for Other Services PT consult     Precautions / Restrictions Precautions Precautions: Fall Restrictions Weight Bearing Restrictions: No      Mobility Bed Mobility Overal bed mobility: Needs Assistance Bed Mobility: Supine to Sit     Supine to sit: Max assist;+2 for physical assistance     General bed mobility comments: Max A to bring hips towards EOB with pad and trunk to upright. Required Max A to bring legs off bed. Able to initiate movement of legs and reach towards bedrail  Transfers Overall transfer level: Needs assistance Equipment used: 1 person hand held assist Transfers: Sit to/from Stand Sit to Stand: Max assist;+2 safety/equipment         General transfer  comment: Able to power up with Mod A and required Max A for steadying    Balance Overall balance assessment: Needs assistance Sitting-balance support: Bilateral upper extremity supported;Feet supported Sitting balance-Leahy Scale: Zero Sitting balance - Comments: Max A to maintain unsupported sitting at EOB.  Postural control: Posterior lean;Left lateral lean Standing balance support: Bilateral upper extremity supported;During functional activity Standing balance-Leahy Scale: Zero Standing balance comment: Max A +2 to maintain standing balance                           ADL either performed or assessed with clinical judgement   ADL Overall ADL's : Needs assistance/impaired Eating/Feeding: Set up;Sitting   Grooming: Set up;Sitting Grooming Details (indicate cue type and reason): supported sitting required. Pt unable to maintain sitting posture without Max A Upper Body Bathing: Moderate assistance;Sitting Upper Body Bathing Details (indicate cue type and reason): Supported sitting Lower Body Bathing: Maximal assistance;+2 for safety/equipment;Sit to/from stand   Upper Body Dressing : Moderate assistance;Sitting Upper Body Dressing Details (indicate cue type and reason): Supported sitting Lower Body Dressing: Maximal assistance;+2 for safety/equipment;Sit to/from stand   Toilet Transfer: Maximal assistance;+2 for physical assistance;Ambulation (Simulated to recliner)           Functional mobility during ADLs: Maximal assistance;Cane;+2 for physical assistance (A few steps only) General ADL Comments: Pt requiring Max A for both sitting and standing balance. Pt demonstrating generalized weakness.      Vision         Perception     Praxis      Pertinent  Vitals/Pain Pain Assessment: Faces Faces Pain Scale: Hurts a little bit Pain Location: Generalized Pain Descriptors / Indicators: Discomfort;Grimacing Pain Intervention(s): Monitored during session;Repositioned      Hand Dominance Right   Extremity/Trunk Assessment Upper Extremity Assessment Upper Extremity Assessment: Generalized weakness   Lower Extremity Assessment Lower Extremity Assessment: Generalized weakness   Cervical / Trunk Assessment Cervical / Trunk Assessment: Kyphotic   Communication Communication Communication: HOH   Cognition Arousal/Alertness: Awake/alert Behavior During Therapy: WFL for tasks assessed/performed Overall Cognitive Status: Within Functional Limits for tasks assessed                                 General Comments: Decreased memory, attention, and problem solving. WFL of age   General Comments  Daughter present throughout session    Exercises     Shoulder Instructions      Home Living Family/patient expects to be discharged to:: Private residence Living Arrangements: Alone Available Help at Discharge: Family;Available 24 hours/day Type of Home: House Home Access: Stairs to enter     Home Layout: Able to live on main level with bedroom/bathroom;Laundry or work area in basement     [employer] Shower/Tub: Chief Strategy Officer: Standard (toilet riser)     Home Equipment: Surveyor, minerals - quad   Additional Comments: Daughter says dc home till they move to [place]      Prior Functioning/Environment Level of Independence: Independent with assistive device(s)        Comments: Per pt and daughter, performing ADLs and light IADLs.  Recently using cane for functional mobility.        OT Problem List: Decreased strength;Decreased range of motion;Decreased activity tolerance;Impaired balance (sitting and/or standing);Decreased safety awareness;Decreased knowledge of use of DME or AE;Decreased knowledge of precautions;Pain      OT Treatment/Interventions: Self-care/ADL training;Therapeutic exercise;Energy conservation;DME and/or AE instruction;Therapeutic activities;Patient/family education    OT Goals(Current  goals can be found in the care plan section) Acute Rehab OT Goals Patient Stated Goal: Go home OT Goal Formulation: With patient/family Time For Goal Achievement: 12/22/16 Potential to Achieve Goals: Good ADL Goals Pt Will Perform Grooming: with min guard assist;standing Pt Will Perform Upper Body Dressing: with min guard assist;sitting Pt Will Perform Lower Body Dressing: sit to/from stand;with min assist Pt Will Transfer to Toilet: ambulating;bedside commode;with min guard assist Pt Will Perform Toileting - Clothing Manipulation and hygiene: with min guard assist;sit to/from stand  OT Frequency: Min 2X/week   Barriers to D/C:            Co-evaluation PT/OT/SLP Co-Evaluation/Treatment: Yes Reason for Co-Treatment: For patient/therapist safety;To address functional/ADL transfers PT goals addressed during session: Mobility/safety with mobility OT goals addressed during session: ADL's and self-care      AM-PAC PT "6 Clicks" Daily Activity     Outcome Measure Help from another person eating meals?: A Little Help from another person taking care of personal grooming?: A Lot Help from another person toileting, which includes using toliet, bedpan, or urinal?: A Lot Help from another person bathing (including washing, rinsing, drying)?: A Lot Help from another person to put on and taking off regular upper body clothing?: A Lot Help from another person to put on and taking off regular lower body clothing?: A Lot 6 Click Score: 13   End of Session Equipment Utilized During Treatment: Gait belt;Oxygen (2L) Nurse Communication: Mobility status  Activity Tolerance: Patient limited by fatigue Patient left: in  chair;with call bell/phone within reach;with family/visitor present  OT Visit Diagnosis: Unsteadiness on feet (R26.81);Other abnormalities of gait and mobility (R26.89);Muscle weakness (generalized) (M62.81)                Time: 6962-9528 OT Time Calculation (min): 31 min Charges:   OT General Charges $OT Visit: 1 Visit OT Evaluation $OT Eval Moderate Complexity: 1 Mod G-Codes:     Christeena Krogh MSOT, OTR/L Acute Rehab Pager: [phone] Office: [phone]  Theodoro Grist Kamelia Lampkins 12/08/2016, 4:05 PM

## 2016-12-09 LAB — BASIC METABOLIC PANEL
Anion gap: 7 (ref 5–15)
BUN: 28 mg/dL — AB (ref 6–20)
CHLORIDE: 109 mmol/L (ref 101–111)
CO2: 27 mmol/L (ref 22–32)
CREATININE: 0.87 mg/dL (ref 0.44–1.00)
Calcium: 8.1 mg/dL — ABNORMAL LOW (ref 8.9–10.3)
GFR calc Af Amer: >60 mL/min (ref >60)
GFR calc non Af Amer: 52 mL/min — ABNORMAL LOW (ref >60)
GLUCOSE: 104 mg/dL — AB (ref 65–99)
POTASSIUM: 3.4 mmol/L — AB (ref 3.5–5.1)
SODIUM: 143 mmol/L (ref 135–145)

## 2016-12-09 MED ORDER — METOPROLOL TARTRATE 5 MG/5ML IV SOLN
2.5000 mg | Freq: Once | INTRAVENOUS | Status: AC
  Administered 2016-12-09: 2.5 mg via INTRAVENOUS
  Filled 2016-12-09: qty 5

## 2016-12-09 MED ORDER — POTASSIUM CHLORIDE CRYS ER 20 MEQ PO TBCR
40.0000 meq | EXTENDED_RELEASE_TABLET | ORAL | Status: AC
  Administered 2016-12-09 (×2): 40 meq via ORAL
  Filled 2016-12-09 (×3): qty 2

## 2016-12-09 MED ORDER — TERBINAFINE HCL 1 % EX CREA: TOPICAL_CREAM | Freq: Every day | CUTANEOUS | 0 refills | Status: AC

## 2016-12-09 NOTE — Progress Notes (Addendum)
Patient's daughter refusing SNF at this time and states she will take patient home.  CSW signing off.  Osborne Cascoadia Netasha Wehrli LCSWA [phone]

## 2016-12-09 NOTE — Progress Notes (Addendum)
Follow-up with patient/daughter at bedside. Patient is irritable. Declining SNF. Does not want her current home health organization changed. Daughter meeting with care management to discuss disposition. Not interested in outpatient palliative f/u. PMT will sign off. Please re-consult PMT if necessary.   NO CHARGE  [name], FNP-C Palliative Medicine Team  Phone: [phone] Fax: [phone]

## 2016-12-09 NOTE — Care Management Note (Addendum)
Case Management Note  Patient Details  Name: Laurie Murphy MRN: [id number] Date of Birth: 03/11/1912  Subjective/Objective:          [age] y.o. female presented with anasarca. PMH is significant for hypertension, DVT, protein calorie malnutrition, epidural hematoma, osteoporosis, atrial flutter, venous insufficiency & fall risk. From home alone.         Laurie Murphy (daughter) [phone]  PCP: Elwanda Brooklynaniel Warden  Action/Plan: Plan is to d/c to home when medically stable with home health services. Daughter to provide pt with 24/7 PCS services @ d/c. CM following for disposition needs.  12/09/2016 @ 1633 CM called and arranged transportation pick up with [facility] for transportation to home.  Expected Discharge Date:                  Expected Discharge Plan:  Home w Home Health Services  In-House Referral:     Discharge planning Services  CM Consult  Post Acute Care Choice:    Choice offered to:  [facility]([place], daughter)  DME Arranged:   [facility] DME Agency:     HH Arranged:   PT,OT,RN,Nurse Aide HH Agency:  [facility]  [facility]), [phone]  Status of Service:  Completed, signed off  If discussed at Long Length of Stay Meetings, dates discussed:    Additional Comments:  Epifanio LeschesCole, Amilio Zehnder Hudson, RN 12/09/2016, 11:40 AM

## 2016-12-09 NOTE — Progress Notes (Signed)
Family Medicine Teaching Service Daily Progress Note Intern Pager: [phone]  Patient name: Laurie SignsGladys F Shams Medical record number: [id number] Date of birth: [birth year] Age: [age] y.o. Gender: female  Primary Care Provider: Renne MuscaWarden, Daniel L, MD Consultants: ccm, cardio, palliative Code Status: DNR  Pt Overview and Major Events to Date:  Laurie Murphy is a [age] y.o. female presenting with anasarca. PMH is significant for hypertension, DVT, protein calorie malnutrition, epidural hematoma, osteoporosis, atrial flutter, venous insufficiency & fall risk.   Assessment and Plan: Laurie SignsGladys F Norby is a [age] y.o. female presenting with anasarca. PMH is significant for hypertension, DVT, protein calorie malnutrition, epidural hematoma, osteoporosis, atrial flutter, venous insufficiency & fall risk.   Cardiac concerns (potential cause of edema/effusions) Patient with some underlying heart failure, pulmonary hypertension, aortic stenosis. BNP elevated to 302. CXR with interstitial coarsening concerning for CHF versus multifocal infection. Echocardiogram in 05/2016 showed EF of 55-60%, severe aortic stenosis with a valve area of 0.8 cm, mod to severely dilated LA and PAPP of 65 mmHg. Her weight is up to 116 down from 134 pounds on admission. Baseline appears to be 114 -patient is not a candidate for procedural intervention per cardiology -may not diurese further at this time as could be at dry weight -continue home metoprolol -will discuss further medication optimization but there may not be much else to do medicinally   Bilateral Effusions: potentially cardiac related, potential related to R breast mass. Patient is not a candidate for thoracentesis or other procedural intervention per ccm.  Likely cardiac/malignancy causes are not treatable procedural  -will consider appropriateness of lasix outpatient with potassium supplementaion  R breast mass: Patient declines workup and oncology referal per palliative  meeting  Hypoalbuminemia.s/p 3 doses 10/30 overnight -Strict I and O, daily weights -Nutrition recs -will consider retesting/additional orders given clinical progression  Atrial flutter/A. Fib without RVR: Not a good candidate for anticoagulation due to her living situation, history of epidural hematoma and high risk for fall. Rate controlled on metoprolol -Continue home metoprolol -We'll continue monitoring  Hypothermia- 97.5 oral this AM -consider warming blanket  History of hypertension: Appears to be on amlodipine unknown. Normotensive now -Stop amlodipine given edema  Hypokalemia:  Replenish with kdur, recheck in AM  Protein calorie malnutrition: Albumin and prealbumin low at 2.4 and 13.6 respectively -Nutrition consult  History of DVT: not  any anticoagulation. Not a good candidate either  Tinea pedis: Bilateral -Terbinafine cream daily  Foot ulcer: She has second degree foot ulcer about 1.5 cm in diameter over her right tarsometatarsal joint. -Dressing per nurse -Wound care   Code status: DNR  FEN/GI: -Heart healthy diet  Prophylaxis: -Lovenox  Disposition: currently desire home health and eventual move to [place].  Consistently refuses SNF, daughter is adamant that patient will not leave until home health is completely arranged.  Situation discussed with CM  Subjective:  Patient was sleeping on morning rounds and physical exam was done but she was too sleepy to talk at length   Objective: Temp:  [98.2 F (36.8 C)] 98.2 F (36.8 C) (11/02 0604) Pulse Rate:  [80-89] 83 (11/02 0604) Resp:  [18] 18 (11/02 0604) BP: (133-159)/(77-98) 148/82 (11/02 0604) SpO2:  [95 %-98 %] 98 % (11/02 0604) Weight:  [125 lb 10.6 oz (57 kg)] 125 lb 10.6 oz (57 kg) (11/01 1345) Physical Exam: GEN: sleepy, said hello and then went back to sleep Head: normocephalic and atraumatic  CVS: Irregularly irregular, nl s1 & s2, systolic murmur, no edema in  her legs, 2+ in hips  and elbows (still migratory edema, slightly improved), very faint DP in LE/UE RESP:  ( anteriorly and axillary bilaterally) decreased lung sound with crackles, wheezing noted GI: BS present & normal, soft, NTND. No mass noted in abdomen MSK: no focal tenderness aside from ulcer, or notable swelling to legs, edema now mainly in elbos bilaterally SKIN: Circular second-degree ulcer over her right first tarsometatarsal joint and about 1.5 cm in diameter, no surrounding cellulitic picture NEURO: no focal deficits noted    Laboratory:  Recent Labs Lab 12/04/16 0432 12/06/16 0624 12/07/16 0658  WBC 5.8 11.0* 10.4  HGB 11.8* 13.3 13.2  HCT 36.5 40.5 39.9  PLT 116* 137* 102*    Recent Labs Lab 12/03/16 1151 12/04/16 0432 12/06/16 0624 12/07/16 0658 12/08/16 0708  NA 144 142 145 143 144  K 3.0* 3.2* 3.8 3.4* 3.0*  CL 112* 109 111 107 106  CO2 25 25 23 25 28   BUN 25* 21* 25* 30* 29*  CREATININE 0.97 1.01* 1.09* 1.10* 0.98  CALCIUM 8.2* 8.2* 8.5* 8.4* 8.2*  PROT 6.9 6.8  --   --   --   BILITOT 1.0 0.6  --   --   --   ALKPHOS 106 109  --   --   --   ALT 21 23  --   --   --   AST 28 30  --   --   --   GLUCOSE 110* 106* 106* 96 85    Imaging/Diagnostic Tests: Dg Chest 2 View  Result Date: 12/03/2016 CLINICAL DATA:  Shortness of breath EXAM: CHEST  2 VIEW COMPARISON:  10/28/2009 FINDINGS: Low volume chest with coarse interstitial opacities. There is pleural based thickening laterally on the right and at the base on the left. Apparent cardiomegaly. Stable mediastinal contours allowing for rotation. IMPRESSION: 1. Chronic lung disease. Interstitial coarsening above prior baseline with small effusions, CHF versus multifocal infection. 2. Pleural thickening along the peripheral right chest that is indeterminate. Recommend radiographic follow up after convalescence. Electronically Signed   By: Marnee Spring M.D.   On: 12/03/2016 13:19     Marthenia Rolling, DO 12/09/2016, 7:40 AM PGY-1,  [facility] FPTS Intern pager: [phone], text pages welcome

## 2016-12-10 MED ORDER — METOPROLOL TARTRATE 25 MG PO TABS: 25.0000 mg | ORAL_TABLET | Freq: Two times a day (BID) | ORAL | 0 refills | Status: AC

## 2016-12-10 MED ORDER — METOPROLOL TARTRATE 5 MG/5ML IV SOLN
2.5000 mg | Freq: Once | INTRAVENOUS | Status: AC
  Administered 2016-12-10: 2.5 mg via INTRAVENOUS
  Filled 2016-12-10: qty 5

## 2016-12-10 MED ORDER — METOPROLOL TARTRATE 25 MG PO TABS: 25.0000 mg | ORAL_TABLET | Freq: Two times a day (BID) | ORAL | Status: DC

## 2016-12-10 MED ORDER — METOPROLOL TARTRATE 12.5 MG HALF TABLET
12.5000 mg | ORAL_TABLET | Freq: Once | ORAL | Status: AC
  Administered 2016-12-10: 12.5 mg via ORAL
  Filled 2016-12-10: qty 1

## 2016-12-10 NOTE — Progress Notes (Signed)
Garrel RidgelGladys F Cronkright to be D/C'd home per MD order. Discussed with the patient and daughter and all questions fully answered.  Allergies as of 12/10/2016   No Known Allergies     Medication List    STOP taking these medications   amLODipine 10 MG tablet Commonly known as:  NORVASC     TAKE these medications   acetaminophen 325 MG tablet Commonly known as:  TYLENOL Take 2 tablets (650 mg total) by mouth every 6 (six) hours as needed for mild pain (or Fever >/= 101).   carboxymethylcellulose 0.5 % Soln Commonly known as:  REFRESH PLUS Place 1 drop into the left eye 3 (three) times daily as needed (for dry eyes).   ketoconazole 2 % cream Commonly known as:  NIZORAL Apply 1 application topically daily. Do not apply in between toes   metoprolol tartrate 25 MG tablet Commonly known as:  LOPRESSOR Take 0.5 tablets (12.5 mg total) by mouth 2 (two) times daily. What changed:  Another medication with the same name was added. Make sure you understand how and when to take each.   metoprolol tartrate 25 MG tablet Commonly known as:  LOPRESSOR Take 1 tablet (25 mg total) by mouth 2 (two) times daily. What changed:  You were already taking a medication with the same name, and this prescription was added. Make sure you understand how and when to take each.   polyvinyl alcohol 1.4 % ophthalmic solution Commonly known as:  LIQUIFILM TEARS Place 1 drop into both eyes 3 (three) times daily as needed for dry eyes.   terbinafine 1 % cream Commonly known as:  LAMISIL Apply topically daily.   Travoprost (BAK Free) 0.004 % Soln ophthalmic solution Commonly known as:  TRAVATAN Place 1 drop into the left eye at bedtime.            Durable Medical Equipment        Start     Ordered   12/09/16 1414  For home use only DME 4 wheeled rolling walker with seat  Once    Question:  Patient needs a walker to treat with the following condition  Answer:  Dependent on walker for ambulation   12/09/16  1413   12/09/16 1414  For home use only DME Shower stool  Once     12/09/16 1413      VVS, Skin clean, dry and intact without evidence of skin break down. IV catheter discontinued intact. Site without signs and symptoms of complications. Dressing and pressure applied.  An After Visit Summary was printed and given to the patient's daughter and transport services.  Patient escorted via stretcher, and D/C home via private auto.  Jon GillsElisa R Maylin [place]  12/10/2016 1845

## 2016-12-10 NOTE — Progress Notes (Signed)
Patient's cane was left in room after patient was discharged. Voicemail left for daughter. Cane tagged with patient's name and left in utility room by nurses station.

## 2016-12-10 NOTE — Progress Notes (Signed)
FMTS Attending Daily Note:  S: Patient appears somewhat delirious although she can perk up and answer questions appropriately when asked.  Exam: Patient is slumped over in bed with eyes closed.  Breathing is normal.  Heart rate is around 100-110 and irregular.  Lungs with coarse breath sounds bases bilaterally.  Abdomen is nontender.  She does have +1-+2 pitting edema in the dependent areas of her extremities both elbows and hips.  Dark urine noted in Foley collection container.  Impression/plan: I had a 45-minute conversation with the daughter, niece, patient herself is the plan.  Again reiterated the tenuous nature of severe aortic stenosis plus fluid overload and the fact that the patient is preload dependent.  She is up 3 pounds from the past 2 days.  Her urine has darkened and she appears to be intravascularly dry.  Lips are dry.  Discussed that she has some degree of likely delirium from the fact that she has been kept awake at night and is sleeping during the day and her blinds are closed and all the lights are on.  Family is ready to take the patient home and get her back to her own bed.  They are much more comfortable with getting her back home today than they were yesterday.  We are going to increase her metoprolol to 25 mg p.o. twice daily.  This will help her heart rate.  She is following up with us in 4 8 hours on Monday.  We will weigh her at that visit and check a BMET for her potassium.  If her weight continues to increase will likely restart her Lasix.  She appears to be dry currently so we are going to hold on giving her any more Lasix.  The family is in agreement with this as there worried that she is not drinking enough do not want her to remain dehydrated.  We are going to try compression stockings at home plus compression around her arms as tolerated to help with third spacing.  Plan discharge home today with follow-up on Monday.  Family appreciative of visit.  Tobey GrimWalden, Caleb Prigmore H,  MD 12/10/2016 1:53 PM

## 2016-12-12 ENCOUNTER — Other Ambulatory Visit: Payer: Self-pay | Admitting: Family Medicine

## 2016-12-12 ENCOUNTER — Telehealth: Payer: Self-pay | Admitting: Family Medicine

## 2016-12-12 ENCOUNTER — Ambulatory Visit: Payer: Self-pay | Admitting: Student

## 2016-12-12 ENCOUNTER — Telehealth: Payer: Self-pay | Admitting: Student

## 2016-12-12 DIAGNOSIS — Z515 Encounter for palliative care: Secondary | ICD-10-CM

## 2016-12-12 NOTE — Telephone Encounter (Signed)
Magda Pagan[facility]udrey for [facility], patient just d/c from hospital on 11/3. Patient is actively dying, and needs a STAT hospice referral placed.

## 2016-12-12 NOTE — Telephone Encounter (Signed)
Dr. Myrtie SomanWarden,   I have to referral ready, just need signatures. I just spoke to Dr. Alanda SlimGonfa and he agreed to sign forms, since patient was scheduled to see him today. Appt has been canceled.

## 2016-12-12 NOTE — Telephone Encounter (Signed)
[facility]

## 2016-12-12 NOTE — Telephone Encounter (Signed)
Hey Crist Infanteia,  Is this just an ambulatory referral?  Thanks, Jesusita OkaDan

## 2016-12-12 NOTE — Telephone Encounter (Signed)
Patient was on my schedule for this afternoon for hospital follow up. Patient is known to me while I was on inpatient last month. This morning, I heard from the front desk that patient is actively dying and need urgent referral to hospice. I filled the referral form and gave back Tia, who faxed the [facility].  Few minutes later, this phone call from her daughter came through and forwarded to me by front desk. Patient's daughter is concerned about: 1. Patient not taking anything by mouth for the last two days. She asks if she can give her something by mouth or if [facility]. I advised her to wait on hospice to arrive as we have already ordered an urgent referral. I also suggested trying sips with caution as there is risk for chocking or aspiration. I also suggested calling 911 or bringing her to ED if she needs urgent attention.  2. Hospital bed: I recommended waiting on hospice for recommendation on this as well. She may discuss this with PCP later. I told her that I will forward the note to PCP.

## 2016-12-12 NOTE — Telephone Encounter (Signed)
Great. Thank you Laurie Murphy and Laurie Murphy!  Laurie Smits L. Laurie SomanWarden, MD [facility] Resident PGY-2 12/12/2016 11:06 AM

## 2016-12-12 NOTE — Progress Notes (Signed)
Placed order for hospice referral for this actively dying [age]yo F with severe aortic stenosis.   Crescentia Boutwell L. Myrtie SomanWarden, MD [facility] Resident PGY-2 12/12/2016 10:54 AM

## 2016-12-15 ENCOUNTER — Telehealth: Payer: Self-pay | Admitting: Family Medicine

## 2016-12-15 NOTE — Telephone Encounter (Signed)
Death Certificate was [facility] by [facility] [employer] and has been placed in PCP's box for completion. Please use blue or black ink. Please return to [place] once completed. Thank you.

## 2016-12-19 ENCOUNTER — Telehealth: Payer: Self-pay | Admitting: Student

## 2016-12-19 NOTE — Telephone Encounter (Signed)
Asked to fill patient's death certificate by the clinic staff.  PCP is post call after night duty. Called and talked to patient's daughter.   Expressed my condolences about her mother's death. Per daughter, patient passed away at hospice on 12/25/2016 at 5:09 AM.  Death certificate completed and returned to clinic staff Annice Pih(Jackie).  Daughter is appreciative about the call.

## 2017-01-07 DEATH — deceased

## 2018-06-19 IMAGING — CR DG CHEST 2V
2 series · 2 of 2 positions shown · non-contrast
Comparison: 10/28/2009

CLINICAL DATA: Shortness of breath

EXAM:
CHEST  2 VIEW

[w chest lat]
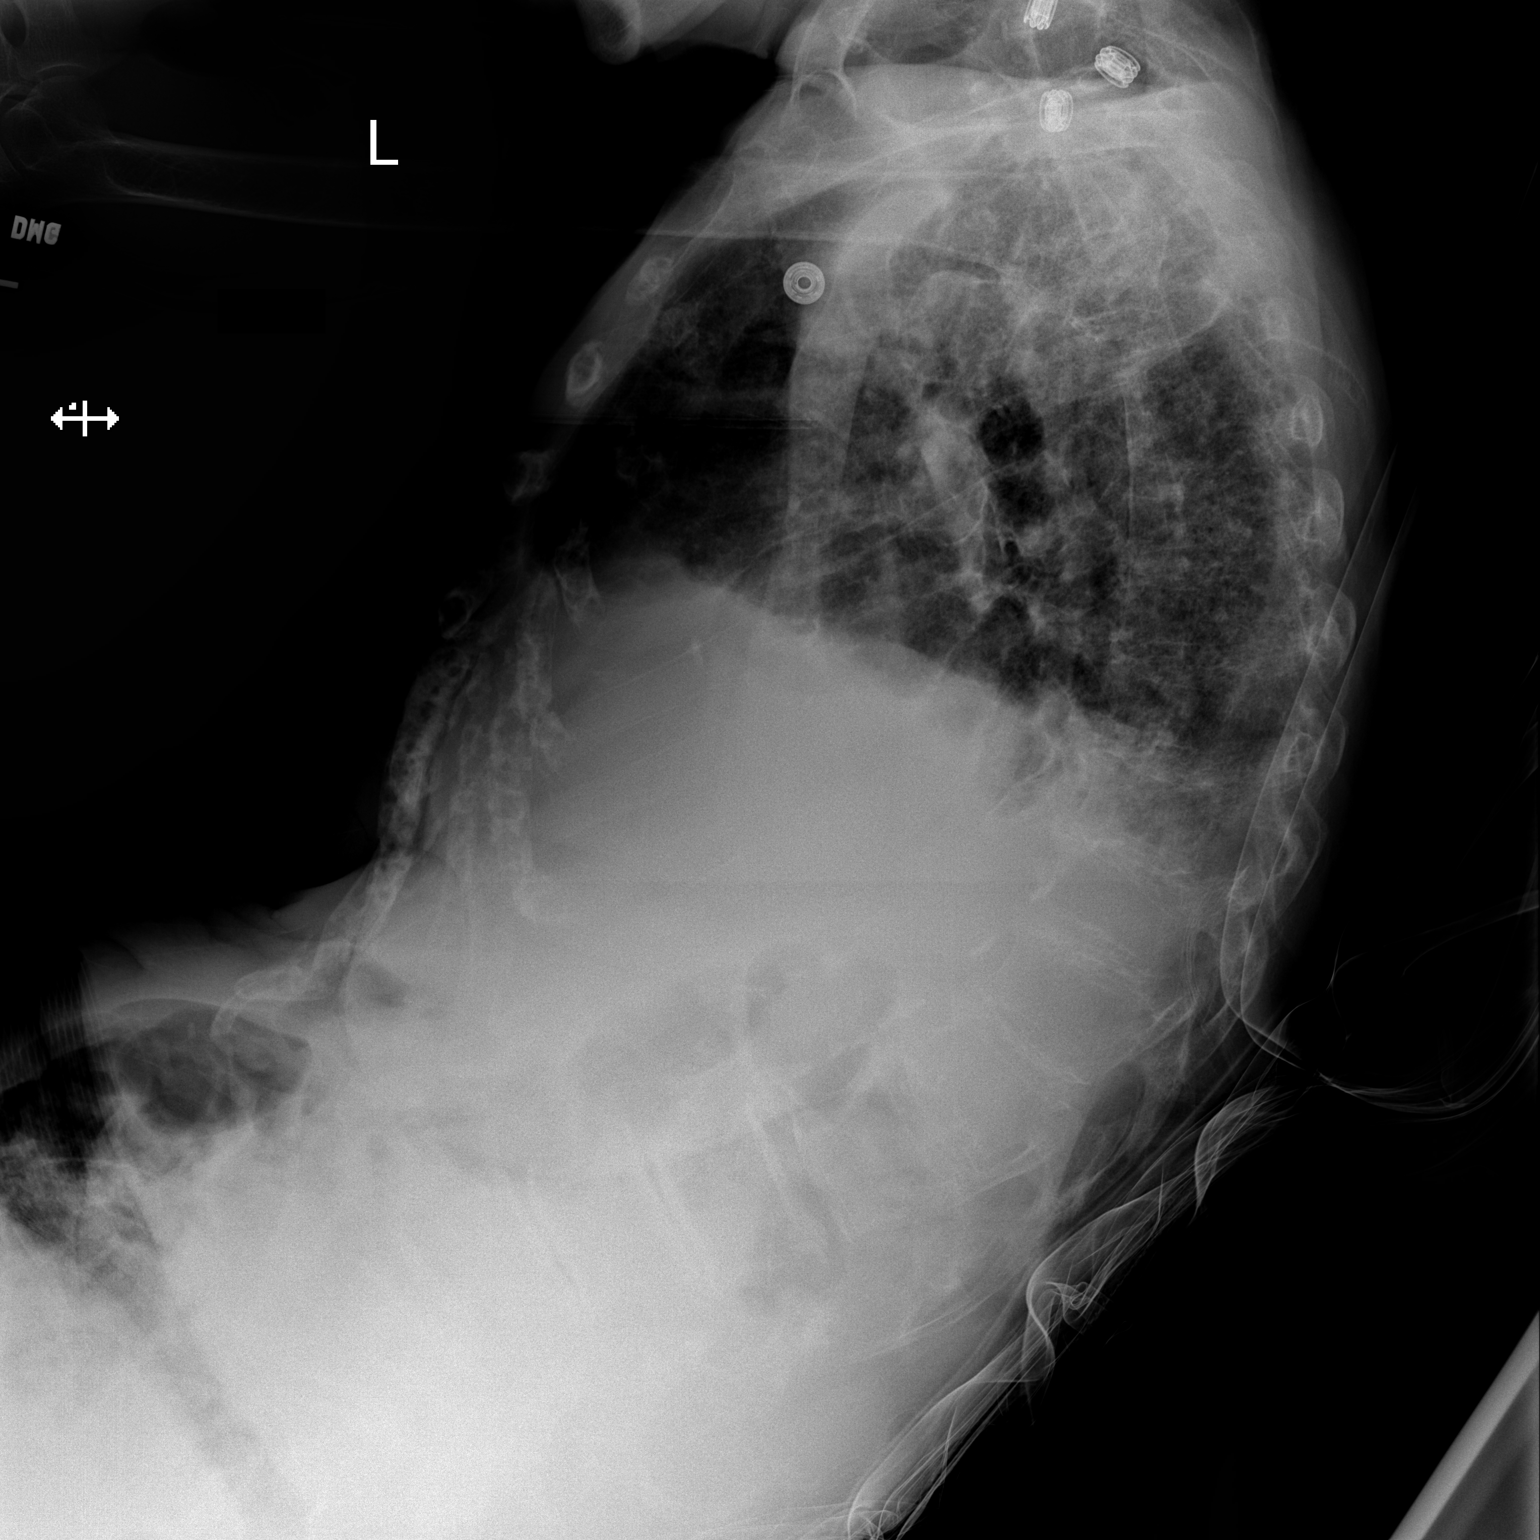

[x chest ap]
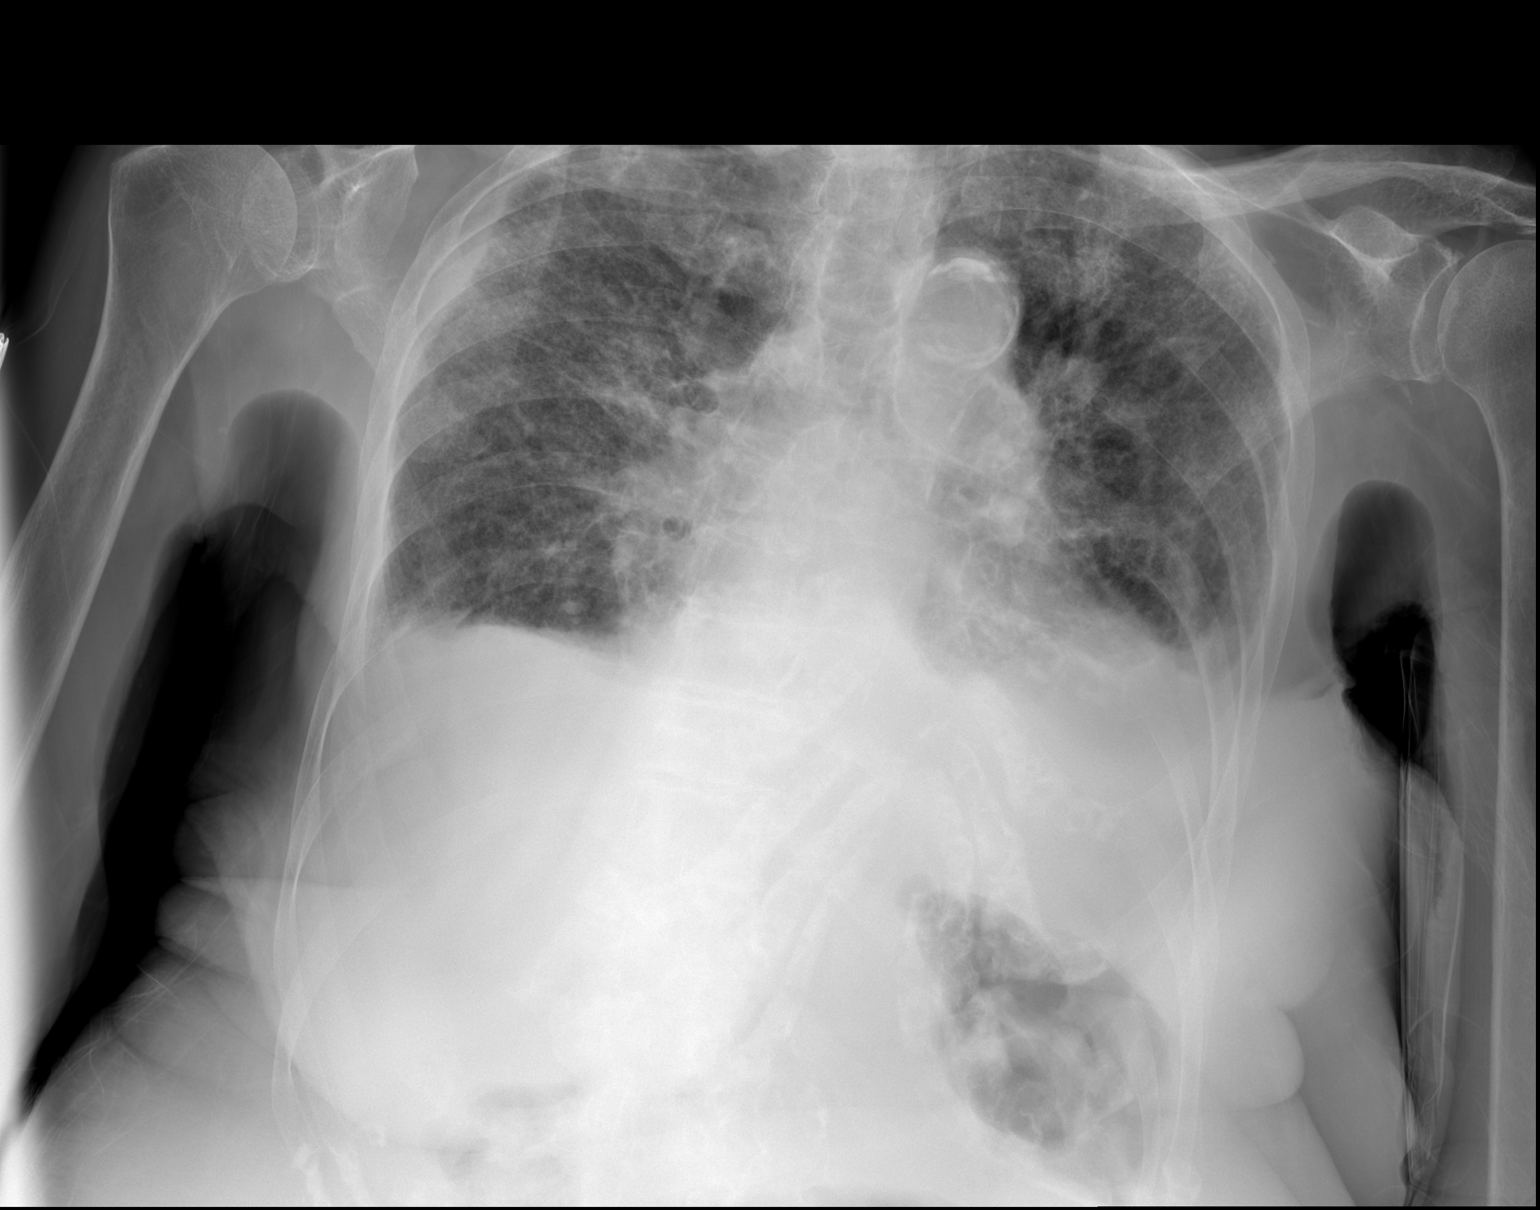

[2 of 2 positions shown; findings below may reference images not displayed]

FINDINGS: Low volume chest with coarse interstitial opacities. There is
pleural based thickening laterally on the right and at the base on
the left. Apparent cardiomegaly. Stable mediastinal contours
allowing for rotation.
IMPRESSION: 1. Chronic lung disease. Interstitial coarsening above prior
baseline with small effusions, CHF versus multifocal infection.
2. Pleural thickening along the peripheral right chest that is
indeterminate. Recommend radiographic follow up after convalescence.
